# Patient Record
Sex: Male | Born: 1960
Health system: Southern US, Community
[De-identification: ages and names within clinical notes are randomized; demographics above are authoritative.]

## PROBLEM LIST (undated history)

## (undated) DIAGNOSIS — E119 Type 2 diabetes mellitus without complications: Secondary | ICD-10-CM

## (undated) DIAGNOSIS — G4730 Sleep apnea, unspecified: Secondary | ICD-10-CM

## (undated) DIAGNOSIS — K219 Gastro-esophageal reflux disease without esophagitis: Secondary | ICD-10-CM

## (undated) DIAGNOSIS — I10 Essential (primary) hypertension: Secondary | ICD-10-CM

## (undated) HISTORY — PX: APPENDECTOMY: SHX54

## (undated) HISTORY — PX: HERNIA REPAIR: SHX51

## (undated) HISTORY — PX: BACK SURGERY: SHX140

## (undated) HISTORY — PX: SINOSCOPY: SHX187

## (undated) HISTORY — PX: KNEE ARTHROSCOPY: SUR90

## (undated) HISTORY — DX: Type 2 diabetes mellitus without complications: E11.9

---

## 1987-11-14 HISTORY — PX: RHINOPLASTY: SUR1284

## 1998-06-14 ENCOUNTER — Observation Stay (HOSPITAL_COMMUNITY): Admission: RE | Admit: 1998-06-14 | Discharge: 1998-06-15 | Payer: Self-pay | Admitting: Specialist

## 1999-12-05 ENCOUNTER — Emergency Department (HOSPITAL_COMMUNITY): Admission: EM | Admit: 1999-12-05 | Discharge: 1999-12-06 | Payer: Self-pay | Admitting: Emergency Medicine

## 1999-12-06 ENCOUNTER — Encounter: Payer: Self-pay | Admitting: Emergency Medicine

## 2005-04-21 ENCOUNTER — Inpatient Hospital Stay (HOSPITAL_COMMUNITY): Admission: RE | Admit: 2005-04-21 | Discharge: 2005-04-24 | Payer: Self-pay | Admitting: Specialist

## 2006-01-17 ENCOUNTER — Emergency Department (HOSPITAL_COMMUNITY): Admission: EM | Admit: 2006-01-17 | Discharge: 2006-01-17 | Payer: Self-pay | Admitting: Family Medicine

## 2011-02-13 ENCOUNTER — Ambulatory Visit (HOSPITAL_BASED_OUTPATIENT_CLINIC_OR_DEPARTMENT_OTHER)
Admission: RE | Admit: 2011-02-13 | Discharge: 2011-02-13 | Disposition: A | Discharge status: Home / Self Care | Payer: 59 | Source: Ambulatory Visit | Attending: Specialist | Admitting: Specialist

## 2011-02-13 DIAGNOSIS — I10 Essential (primary) hypertension: Secondary | ICD-10-CM | POA: Insufficient documentation

## 2011-02-13 DIAGNOSIS — M23305 Other meniscus derangements, unspecified medial meniscus, unspecified knee: Secondary | ICD-10-CM | POA: Insufficient documentation

## 2011-02-13 DIAGNOSIS — M23302 Other meniscus derangements, unspecified lateral meniscus, unspecified knee: Secondary | ICD-10-CM | POA: Insufficient documentation

## 2011-02-13 DIAGNOSIS — M1710 Unilateral primary osteoarthritis, unspecified knee: Secondary | ICD-10-CM | POA: Insufficient documentation

## 2011-02-13 DIAGNOSIS — Z79899 Other long term (current) drug therapy: Secondary | ICD-10-CM | POA: Insufficient documentation

## 2011-02-13 DIAGNOSIS — E669 Obesity, unspecified: Secondary | ICD-10-CM | POA: Insufficient documentation

## 2011-02-13 DIAGNOSIS — K219 Gastro-esophageal reflux disease without esophagitis: Secondary | ICD-10-CM | POA: Insufficient documentation

## 2011-02-13 DIAGNOSIS — Z01812 Encounter for preprocedural laboratory examination: Secondary | ICD-10-CM | POA: Insufficient documentation

## 2011-02-13 DIAGNOSIS — Z01810 Encounter for preprocedural cardiovascular examination: Secondary | ICD-10-CM | POA: Insufficient documentation

## 2011-02-13 LAB — POCT I-STAT 4, (NA,K, GLUC, HGB,HCT)
Hemoglobin: 15.3 g/dL (ref 13.0–17.0)
Potassium: 4 mEq/L (ref 3.5–5.1)
Sodium: 141 mEq/L (ref 135–145)

## 2011-03-01 NOTE — Op Note (Signed)
  NAME:  Victor Contreras, Victor Contreras NO.:  [id number]  MEDICAL RECORD NO.:  [id number]            PATIENT TYPE:  LOCATION:                                 FACILITY:  PHYSICIAN:  Jene Every, M.D.         DATE OF BIRTH:  DATE OF PROCEDURE:  02/13/2011 DATE OF DISCHARGE:                              OPERATIVE REPORT   PREOPERATIVE DIAGNOSES:  Medial meniscus tear and degenerative joint disease, left knee.  POSTOPERATIVE DIAGNOSES:  Medial meniscus tear and degenerative joint disease, left knee, lateral meniscus tear.  PROCEDURE PERFORMED: 1. Left knee arthroscopy. 2. Partial medial and lateral meniscectomy. 3. Chondroplasty of patella.  ASSISTANT:  None.  BRIEF HISTORY:  This is a 50 year old with knee pain, locking, giving way.  MRI, meniscus tear, synovitis, indicated for diagnostic arthroscopy and debridement.  Risks and benefits were discussed including bleeding, infection, no change in symptoms, worsening of symptoms, need for repeat debridement, DVT, PE, anesthetic complications, etc.  TECHNIQUE:  With the patient in supine position after induction of adequate general anesthesia, 2 g Kefzol, left lower extremity was prepped and draped in the usual sterile fashion.  A lateral parapatellar portal and superomedial parapatellar portals were fashioned with a #11 blade.  Ingress cannula atraumatically placed.  Irrigant was utilized to insufflate the joint.  Under direct visualization, medial parapatellar portal was fashioned with a #11 blade after localization with an 18- gauge needle sparing the medial meniscus.  Noted was a posterior horn medial meniscus tear.  I introduced straight basket rongeur, performed a partial medial meniscectomy to a stable base.  Further contoured with the 3-5 Cuda shaver.  Next, this was probed and found to be stable on probe palpation, approximately posterior third was excised.  ACL and PCL were unremarkable.  Lateral compartment  revealed degenerative tearing of lateral meniscus.  This was shaved to a stable base.  Femoral condyle and tibial plateau were unremarkable.  Suprapatellar pouch revealed some minor grade 2 changes of patella and femoral sulcus.  There was normal patellofemoral tracking.  Gutters were unremarkable.  Reexamined all compartments.  No further pathology amenable arthroscopic intervention and therefore removed all instrumentation.  Portals were closed 4-0 nylon simple sutures.  A 0.25% Marcaine with epinephrine was infiltrated in the joint and wound was dressed sterilely.  Awoken without difficulty and transported to recovery room in satisfactory condition.  The patient tolerated the procedure well.  There were no complications.     Jene Every, M.D.     Cordelia Pen  D:  02/13/2011  T:  02/13/2011  Job:  [id number]  Electronically Signed by Jene Every M.D. on 03/01/2011 12:04:50 PM

## 2012-08-27 ENCOUNTER — Encounter (HOSPITAL_COMMUNITY): Payer: Self-pay | Admitting: Emergency Medicine

## 2012-08-27 ENCOUNTER — Emergency Department (HOSPITAL_COMMUNITY)
Admission: EM | Admit: 2012-08-27 | Discharge: 2012-08-27 | Disposition: A | Discharge status: Home / Self Care | Payer: 59 | Attending: Emergency Medicine | Admitting: Emergency Medicine

## 2012-08-27 DIAGNOSIS — I10 Essential (primary) hypertension: Secondary | ICD-10-CM | POA: Insufficient documentation

## 2012-08-27 DIAGNOSIS — M5430 Sciatica, unspecified side: Secondary | ICD-10-CM | POA: Insufficient documentation

## 2012-08-27 DIAGNOSIS — K219 Gastro-esophageal reflux disease without esophagitis: Secondary | ICD-10-CM | POA: Insufficient documentation

## 2012-08-27 HISTORY — DX: Essential (primary) hypertension: I10

## 2012-08-27 HISTORY — DX: Gastro-esophageal reflux disease without esophagitis: K21.9

## 2012-08-27 MED ORDER — HYDROMORPHONE HCL PF 1 MG/ML IJ SOLN
0.5000 mg | Freq: Once | INTRAMUSCULAR | Status: AC
  Administered 2012-08-27: 0.5 mg via INTRAMUSCULAR
  Filled 2012-08-27: qty 1

## 2012-08-27 MED ORDER — HYDROMORPHONE HCL PF 1 MG/ML IJ SOLN
1.0000 mg | Freq: Once | INTRAMUSCULAR | Status: AC
  Administered 2012-08-27: 1 mg via INTRAMUSCULAR
  Filled 2012-08-27: qty 1

## 2012-08-27 MED ORDER — KETOROLAC TROMETHAMINE 30 MG/ML IJ SOLN
30.0000 mg | Freq: Once | INTRAMUSCULAR | Status: AC
  Administered 2012-08-27: 30 mg via INTRAMUSCULAR
  Filled 2012-08-27: qty 1

## 2012-08-27 NOTE — ED Provider Notes (Signed)
History     CSN: [id number]  Arrival date & time 08/27/12  1000   First MD Initiated Contact with Patient 08/27/12 1003      Chief Complaint  Patient presents with  . Back Pain    (Consider location/radiation/quality/duration/timing/severity/associated sxs/prior treatment) HPI  Pt to the ER with back pain by ambulance. He has a history of back problems which include a herniated disc and a ruptured disc. His last surgery was 5 years ago on L4-L5 and he has not had much problems since then. A couple days ago his back began to bother him again. He went to the Urgent Care and they have referred him to pain management. He does not have an appointment scheduled yet. He also called Dr. Shelle Iron and is waiting fo ran appointment to get into their as well. He is taking Soma, Oxycodone, steroids for the pain but he has been unable to get it under control. The pain starts at his left glut and travels all the way down to his foot. He has had some numbness and a lot of pain. No weakness. He is able to walk but it is very painful. He denies IV drug use, fevers, chills, bowel or urine incontinence.   Past Medical History  Diagnosis Date  . Hypertension   . GERD (gastroesophageal reflux disease)     Past Surgical History  Procedure Date  . Back surgery   . Appendectomy   . Knee arthroscopy   . Hernia repair     No family history on file.  History  Substance Use Topics  . Smoking status: Never Smoker   . Smokeless tobacco: Not on file  . Alcohol Use: Yes      Review of Systems  Constitutional: Negative for fever, chills and fatigue.  HENT: Negative for neck pain.   Respiratory: Negative for cough, shortness of breath and wheezing.   Cardiovascular: Negative for chest pain and leg swelling.  Genitourinary: Negative for difficulty urinating.  Musculoskeletal: Positive for back pain. Negative for myalgias and arthralgias.  Neurological: Negative for dizziness and weakness.    Hematological: Does not bruise/bleed easily.  Psychiatric/Behavioral: Negative for confusion and agitation.      Allergies  Review of patient's allergies indicates no known allergies.  Home Medications   Current Outpatient Rx  Name Route Sig Dispense Refill  . AMLODIPINE BESYLATE 5 MG PO TABS Oral Take 5 mg by mouth daily.    Marland Kitchen CARISOPRODOL 350 MG PO TABS Oral Take 350 mg by mouth 3 (three) times daily as needed. Muscle spasm    . CETIRIZINE HCL 10 MG PO TABS Oral Take 10 mg by mouth daily.    . OMEGA-3 FATTY ACIDS 1000 MG PO CAPS Oral Take 1 g by mouth daily.    Marland Kitchen GABAPENTIN 300 MG PO CAPS Oral Take 300 mg by mouth 3 (three) times daily.    . OXYCODONE-ACETAMINOPHEN 5-325 MG PO TABS Oral Take 1 tablet by mouth every 4 (four) hours as needed. pain    . PANTOPRAZOLE SODIUM 40 MG PO TBEC Oral Take 40 mg by mouth daily.    . RED YEAST RICE 600 MG PO CAPS Oral Take 1 capsule by mouth daily.    Marland Kitchen VALSARTAN-HYDROCHLOROTHIAZIDE 160-12.5 MG PO TABS Oral Take 1 tablet by mouth daily.      BP 142/95  Pulse 110  Temp 97.9 F (36.6 C) (Oral)  Resp 16  SpO2 95%  Physical Exam  Nursing note and vitals reviewed. Constitutional: He  appears well-developed and well-nourished. No distress.  HENT:  Head: Normocephalic and atraumatic.  Eyes: Pupils are equal, round, and reactive to light.  Neck: Normal range of motion. Neck supple.  Cardiovascular: Normal rate and regular rhythm.   Pulmonary/Chest: Effort normal.  Abdominal: Soft.  Musculoskeletal:       Legs:       Equal strength to bilateral lower extremities. Neurosensory  function adequate to both legs. Skin color is normal. Skin is warm and moist. I see no step off deformity, no bony tenderness. Pt is able to ambulate without limp. Pain is relieved when sitting in certain positions. ROM is decreased due to pain. No crepitus, laceration, effusion, swelling.  Pulses are normal   Neurological: He is alert.  Skin: Skin is warm and dry.     ED Course  Procedures (including critical care time)  Labs Reviewed - No data to display No results found.   1. Sciatica       MDM  Pt has pain medication at home. Has appropriate follow-up appointments in the works.    11:30am- patients pain after 30mg  IM of Toradol and  1mg  IM Dilaudid is now a 4/10 from a 10/10. Will give another 0.5mg  IM Dilaudid and then discharge.  Referral to Dr. Shelle Iron given. NO meds Rx due to he already has a bunch from home.  Patient with back pain. No neurological deficits. Patient is ambulatory. No warning symptoms of back pain including: loss of bowel or bladder control, night sweats, waking from sleep with back pain, unexplained fevers or weight loss, h/o cancer, IVDU, recent trauma. No concern for cauda equina, epidural abscess, or other serious cause of back pain. Conservative measures such as rest, ice/heat and pain medicine indicated with PCP follow-up if no improvement with conservative management.        Dorthula Matas, PA 08/27/12 1130

## 2012-08-27 NOTE — ED Notes (Signed)
Waiting for transport home.

## 2012-08-27 NOTE — ED Notes (Signed)
BJY:[id number]<FA> Expected date:<BR> Expected time:<BR> Means of arrival:Ambulance<BR> Comments:<BR> Back pain

## 2012-08-27 NOTE — ED Notes (Signed)
Per EMS, patient has history of L4/L5 problems-flare up in last 24 hours-able to stand and sit-pain radiates down left leg

## 2012-08-27 NOTE — ED Provider Notes (Signed)
Medical screening examination/treatment/procedure(s) were performed by non-physician practitioner and as supervising physician I was immediately available for consultation/collaboration.   Mirai Greenwood B. Bernette Mayers, MD 08/27/12 1137

## 2015-11-17 ENCOUNTER — Encounter (INDEPENDENT_AMBULATORY_CARE_PROVIDER_SITE_OTHER): Payer: Self-pay | Admitting: *Deleted

## 2015-12-13 ENCOUNTER — Ambulatory Visit (INDEPENDENT_AMBULATORY_CARE_PROVIDER_SITE_OTHER): Payer: 59 | Admitting: Internal Medicine

## 2015-12-13 ENCOUNTER — Encounter (INDEPENDENT_AMBULATORY_CARE_PROVIDER_SITE_OTHER): Payer: Self-pay | Admitting: Internal Medicine

## 2015-12-13 ENCOUNTER — Telehealth (INDEPENDENT_AMBULATORY_CARE_PROVIDER_SITE_OTHER): Payer: Self-pay | Admitting: *Deleted

## 2015-12-13 ENCOUNTER — Encounter (INDEPENDENT_AMBULATORY_CARE_PROVIDER_SITE_OTHER): Payer: Self-pay

## 2015-12-13 ENCOUNTER — Other Ambulatory Visit (INDEPENDENT_AMBULATORY_CARE_PROVIDER_SITE_OTHER): Payer: Self-pay | Admitting: Internal Medicine

## 2015-12-13 DIAGNOSIS — R195 Other fecal abnormalities: Secondary | ICD-10-CM

## 2015-12-13 DIAGNOSIS — E119 Type 2 diabetes mellitus without complications: Secondary | ICD-10-CM | POA: Insufficient documentation

## 2015-12-13 DIAGNOSIS — Z1211 Encounter for screening for malignant neoplasm of colon: Secondary | ICD-10-CM

## 2015-12-13 DIAGNOSIS — I10 Essential (primary) hypertension: Secondary | ICD-10-CM | POA: Insufficient documentation

## 2015-12-13 MED ORDER — PEG 3350-KCL-NA BICARB-NACL 420 G PO SOLR: 4000.0000 mL | Freq: Once | ORAL | Status: DC

## 2015-12-13 NOTE — Progress Notes (Addendum)
   Subjective:    Patient ID: Victor Contreras, male    DOB: 12-08-1960, 55 y.o.   MRN: [id number]  HPI Referred by Dr. Manuella Ghazi for heme positive stool card. Patient denies seeing any blood in his stools. He says from time to time he hemorrhoids will bleed. Appetite is good. No weight loss.  There is no abdominal pain.' He has a BM daily. No family hx of colon cancer. Diabetic x 2 yrs. HTN x 4-5 yrs.  His last colonoscopy was in 2011 per Dr. Trena Platt records. Patient states he underwent colonoscopy for blood in his stools and colonoscopy was normal. (Dr. Anthony Sar). I will get the colonoscopy report.   11/10/2006: Dr. Anthony Sar: blood in stools, Personal hx of colonic polyps.  Normal exam.  Review of Systems   Past Medical History  Diagnosis Date  . Hypertension   . GERD (gastroesophageal reflux disease)   . Diabetes [facility])     Past Surgical History  Procedure Laterality Date  . Back surgery    . Appendectomy    . Knee arthroscopy    . Hernia repair      No Known Allergies  Current Outpatient Prescriptions on File Prior to Visit  Medication Sig Dispense Refill  . amLODipine (NORVASC) 5 MG tablet Take 5 mg by mouth daily.    . cetirizine (ZYRTEC) 10 MG tablet Take 10 mg by mouth daily.    . fish oil-omega-3 fatty acids 1000 MG capsule Take 1 g by mouth daily.    . pantoprazole (PROTONIX) 40 MG tablet Take 40 mg by mouth daily.    . valsartan-hydrochlorothiazide (DIOVAN-HCT) 160-12.5 MG per tablet Take 1 tablet by mouth daily.     No current facility-administered medications on file prior to visit.         Objective:   Physical Exam Blood pressure 134/70, pulse 76, temperature 98 F (36.7 C), height 5\' 9"  (1.753 m), weight 271 lb 9.6 oz (123.197 kg).  Alert and oriented. Skin warm and dry. Oral mucosa is moist.   . Sclera anicteric, conjunctivae is pink. Thyroid not enlarged. No cervical lymphadenopathy. Lungs clear. Heart regular rate and rhythm.  Abdomen is soft. Bowel  sounds are positive. No hepatomegaly. No abdominal masses felt. No tenderness.  No edema to lower extremities.         Assessment & Plan:  Guaiac positive stool. Colonic neoplasm, polyp, AVM needs to be ruled out.

## 2015-12-13 NOTE — Patient Instructions (Signed)
The risks and benefits such as perforation, bleeding, and infection were reviewed with the patient and is agreeable. 

## 2015-12-13 NOTE — Telephone Encounter (Signed)
Patient needs trilyte 

## 2016-01-12 ENCOUNTER — Ambulatory Visit (HOSPITAL_COMMUNITY)
Admission: RE | Admit: 2016-01-12 | Discharge: 2016-01-12 | Disposition: A | Discharge status: Home / Self Care | Payer: 59 | Source: Ambulatory Visit | Attending: Internal Medicine | Admitting: Internal Medicine

## 2016-01-12 ENCOUNTER — Encounter (HOSPITAL_COMMUNITY)
Admission: RE | Disposition: A | Discharge status: Home / Self Care | Payer: Self-pay | Source: Ambulatory Visit | Attending: Internal Medicine

## 2016-01-12 ENCOUNTER — Encounter (HOSPITAL_COMMUNITY): Payer: Self-pay | Admitting: *Deleted

## 2016-01-12 ENCOUNTER — Encounter (INDEPENDENT_AMBULATORY_CARE_PROVIDER_SITE_OTHER): Payer: Self-pay

## 2016-01-12 DIAGNOSIS — Z79899 Other long term (current) drug therapy: Secondary | ICD-10-CM | POA: Diagnosis not present

## 2016-01-12 DIAGNOSIS — K219 Gastro-esophageal reflux disease without esophagitis: Secondary | ICD-10-CM | POA: Diagnosis not present

## 2016-01-12 DIAGNOSIS — Z86010 Personal history of colonic polyps: Secondary | ICD-10-CM | POA: Diagnosis not present

## 2016-01-12 DIAGNOSIS — Z794 Long term (current) use of insulin: Secondary | ICD-10-CM | POA: Diagnosis not present

## 2016-01-12 DIAGNOSIS — I10 Essential (primary) hypertension: Secondary | ICD-10-CM | POA: Diagnosis not present

## 2016-01-12 DIAGNOSIS — D120 Benign neoplasm of cecum: Secondary | ICD-10-CM | POA: Diagnosis not present

## 2016-01-12 DIAGNOSIS — D123 Benign neoplasm of transverse colon: Secondary | ICD-10-CM | POA: Insufficient documentation

## 2016-01-12 DIAGNOSIS — E119 Type 2 diabetes mellitus without complications: Secondary | ICD-10-CM | POA: Diagnosis not present

## 2016-01-12 DIAGNOSIS — R195 Other fecal abnormalities: Secondary | ICD-10-CM | POA: Diagnosis not present

## 2016-01-12 DIAGNOSIS — K921 Melena: Secondary | ICD-10-CM | POA: Insufficient documentation

## 2016-01-12 HISTORY — PX: COLONOSCOPY: SHX5424

## 2016-01-12 LAB — GLUCOSE, CAPILLARY: Glucose-Capillary: 134 mg/dL — ABNORMAL HIGH (ref 65–99)

## 2016-01-12 SURGERY — COLONOSCOPY
Anesthesia: Moderate Sedation

## 2016-01-12 MED ORDER — MEPERIDINE HCL 50 MG/ML IJ SOLN
INTRAMUSCULAR | Status: DC | PRN
  Administered 2016-01-12 (×2): 25 mg

## 2016-01-12 MED ORDER — MEPERIDINE HCL 50 MG/ML IJ SOLN
INTRAMUSCULAR | Status: AC
  Filled 2016-01-12: qty 1

## 2016-01-12 MED ORDER — MIDAZOLAM HCL 5 MG/5ML IJ SOLN
INTRAMUSCULAR | Status: DC | PRN
  Administered 2016-01-12: 2 mg via INTRAVENOUS
  Administered 2016-01-12: 3 mg via INTRAVENOUS
  Administered 2016-01-12: 2 mg via INTRAVENOUS
  Administered 2016-01-12: 1 mg via INTRAVENOUS
  Administered 2016-01-12: 2 mg via INTRAVENOUS

## 2016-01-12 MED ORDER — MIDAZOLAM HCL 5 MG/5ML IJ SOLN
INTRAMUSCULAR | Status: AC
  Filled 2016-01-12: qty 10

## 2016-01-12 MED ORDER — STERILE WATER FOR IRRIGATION IR SOLN
Status: DC | PRN
  Administered 2016-01-12: 12:00:00

## 2016-01-12 MED ORDER — SODIUM CHLORIDE 0.9 % IV SOLN
INTRAVENOUS | Status: DC
  Administered 2016-01-12: 1000 mL via INTRAVENOUS

## 2016-01-12 NOTE — Op Note (Signed)
COLONOSCOPY PROCEDURE REPORT  PATIENT:  Victor Contreras  MR#:  [id number] Birthdate:  1960-11-30, 55 y.o., male Endoscopist:  Dr. Rogene Houston, MD Referred By:  Dr. Monico Blitz, MD   Procedure Date: 01/12/2016  Procedure:   Colonoscopy with snare polypectomy and clip application  Indications: Patient is 55 year old African male who was noted to have heme-positive stool. Recent hemoglobin was 14.3 g. He has history of colonic polyps. He denies abdominal pain change in bowel habits rectal bleeding or melena. He has occasional hematochezia in the form of blood and tissue with this bowel movements.  Informed Consent:  The procedure and risks were reviewed with the patient and informed consent was obtained.  Medications:  Demerol 50 mg IV Versed 10 mg IV  First dose administered at 1143  Last dose administered at  1242  Description of procedure:  After a digital rectal exam was performed, that colonoscope was advanced from the anus through the rectum and colon to the area of the cecum, ileocecal valve and appendiceal orifice. The cecum was deeply intubated. These structures were well-seen and photographed for the record. From the level of the cecum and ileocecal valve, the scope was slowly and cautiously withdrawn. The mucosal surfaces were carefully surveyed utilizing scope tip to flexion to facilitate fold flattening as needed. The scope was pulled down into the rectum where a thorough exam including retroflexion was performed. Terminal ileum was also examined.  Findings:   Prep satisfactory. Very tortuous splenic flexure. Normal mucosa of terminal ileum. 12 mm polyp hot snared from cecum. Single indistinct clip applied to polypectomy site.another clip was opened but not to use. 7 mm polyp hot snare from distal transverse colon. Small polyp ablated via cold biopsy from splenic flexure. Normal rectal mucosa and anorectal junction.  Cecal and transverse colon polyps were retrieved using  Roth net.   Therapeutic/Diagnostic Maneuvers Performed:  See above  Complications:  None  EBL: None  Cecal Withdrawal Time:  34 minutes  Impression: Normal mucosa of terminal ileum. 12 mm cecal polyp hot snared. Single instinct clip applied to polypectomy site. 7 mm polyp hot snared from distal transverse colon. Small polyp ablated via cold biopsy from splenic flexure and was submitted along with polyp from transverse colon.   Recommendations:  Standard instructions given. No aspirin or NSAIDs for 1 week. I will contact patient with biopsy results and further recommendations.  REHMAN,NAJEEB U  01/12/2016 1:06 PM  CC: Dr. Monico Blitz, MD & Dr. Rayne Du ref. provider found

## 2016-01-12 NOTE — Discharge Instructions (Signed)
No aspirin or NSAIDs for 1 week. °Resume usual medications and diet. °No driving for 24 hours. °Physician will call with biopsy results. ° ° ° ° °Colonoscopy, Care After °These instructions give you information on caring for yourself after your procedure. Your doctor may also give you more specific instructions. Call your doctor if you have any problems or questions after your procedure. °HOME CARE °· Do not drive for 24 hours. °· Do not sign important papers or use machinery for 24 hours. °· You may shower. °· You may go back to your usual activities, but go slower for the first 24 hours. °· Take rest breaks often during the first 24 hours. °· Walk around or use warm packs on your belly (abdomen) if you have belly cramping or gas. °· Drink enough fluids to keep your pee (urine) clear or pale yellow. °· Resume your normal diet. Avoid heavy or fried foods. °· Avoid drinking alcohol for 24 hours or as told by your doctor. °· Only take medicines as told by your doctor. °If a tissue sample (biopsy) was taken during the procedure:  °· Do not take aspirin or blood thinners for 7 days, or as told by your doctor. °· Do not drink alcohol for 7 days, or as told by your doctor. °· Eat soft foods for the first 24 hours. °GET HELP IF: °You still have a small amount of blood in your poop (stool) 2-3 days after the procedure. °GET HELP RIGHT AWAY IF: °· You have more than a small amount of blood in your poop. °· You see clumps of tissue (blood clots) in your poop. °· Your belly is puffy (swollen). °· You feel sick to your stomach (nauseous) or throw up (vomit). °· You have a fever. °· You have belly pain that gets worse and medicine does not help. °MAKE SURE YOU: °· Understand these instructions. °· Will watch your condition. °· Will get help right away if you are not doing well or get worse. °  °This information is not intended to replace advice given to you by your health care provider. Make sure you discuss any questions you  have with your health care provider. °  °Document Released: 12/02/2010 Document Revised: 11/04/2013 Document Reviewed: 07/07/2013 °Elsevier Interactive Patient Education ©2016 [employer] ° ° °Colon Polyps °Polyps are lumps of extra tissue growing inside the body. Polyps can grow in the large intestine (colon). Most colon polyps are noncancerous (benign). However, some colon polyps can become cancerous over time. Polyps that are larger than a pea may be harmful. To be safe, caregivers remove and test all polyps. °CAUSES  °Polyps form when mutations in the genes cause your cells to grow and divide even though no more tissue is needed. °RISK FACTORS °There are a number of risk factors that can increase your chances of getting colon polyps. They include: °· Being older than 50 years. °· Family history of colon polyps or colon cancer. °· Long-term colon diseases, such as colitis or Crohn disease. °· Being overweight. °· Smoking. °· Being inactive. °· Drinking too much alcohol. °SYMPTOMS  °Most small polyps do not cause symptoms. If symptoms are present, they may include: °· Blood in the stool. The stool may look dark red or black. °· Constipation or diarrhea that lasts longer than 1 week. °DIAGNOSIS °People often do not know they have polyps until their caregiver finds them during a regular checkup. Your caregiver can use 4 tests to check for polyps: °· Digital rectal exam.   The caregiver wears gloves and feels inside the rectum. This test would find polyps only in the rectum. °· Barium enema. The caregiver puts a liquid called barium into your rectum before taking X-rays of your colon. Barium makes your colon look white. Polyps are dark, so they are easy to see in the X-ray pictures. °· Sigmoidoscopy. A thin, flexible tube (sigmoidoscope) is placed into your rectum. The sigmoidoscope has a light and tiny camera in it. The caregiver uses the sigmoidoscope to look at the last third of your colon. °· Colonoscopy. This  test is like sigmoidoscopy, but the caregiver looks at the entire colon. This is the most common method for finding and removing polyps. °TREATMENT  °Any polyps will be removed during a sigmoidoscopy or colonoscopy. The polyps are then tested for cancer. °PREVENTION  °To help lower your risk of getting more colon polyps: °· Eat plenty of fruits and vegetables. Avoid eating fatty foods. °· Do not smoke. °· Avoid drinking alcohol. °· Exercise every day. °· Lose weight if recommended by your caregiver. °· Eat plenty of calcium and folate. Foods that are rich in calcium include milk, cheese, and broccoli. Foods that are rich in folate include chickpeas, kidney beans, and spinach. °HOME CARE INSTRUCTIONS °Keep all follow-up appointments as directed by your caregiver. You may need periodic exams to check for polyps. °SEEK MEDICAL CARE IF: °You notice bleeding during a bowel movement. °  °This information is not intended to replace advice given to you by your health care provider. Make sure you discuss any questions you have with your health care provider. °  °Document Released: 07/26/2004 Document Revised: 11/20/2014 Document Reviewed: 01/09/2012 °Elsevier Interactive Patient Education ©2016 Elsevier Inc. ° °

## 2016-01-12 NOTE — H&P (Signed)
Victor Contreras is an 55 y.o. male.   Chief Complaint:  Patient is here for colonoscopy. HPI:  Asian is 55 year old African-American male who was recently found to have heme-positive stool. He is therefore undergoing diagnostic colonoscopy. He also has history of colonic polyps. These were found on his first colonoscopy by Dr. Anthony Sar several years ago.  No polyps were found on his last colonoscopy of December 2007.  He denies abdominal pain or change in bowel habits. He has occasional hematochezia in the form of blood in the tissue. He denies frank rectal bleeding or melena. Family history is negative for CRC.   Past Medical History  Diagnosis Date  . Hypertension   . GERD (gastroesophageal reflux disease)   . Diabetes [facility])     Past Surgical History  Procedure Laterality Date  . Back surgery    . Appendectomy    . Knee arthroscopy    . Hernia repair      History reviewed. No pertinent family history. Social History:  reports that he has never smoked. He does not have any smokeless tobacco history on file. He reports that he drinks alcohol. He reports that he does not use illicit drugs.  Allergies: No Known Allergies  Medications Prior to Admission  Medication Sig Dispense Refill  . amLODipine (NORVASC) 5 MG tablet Take 5 mg by mouth daily.    . canagliflozin (INVOKANA) 300 MG TABS tablet Take 300 mg by mouth daily before breakfast.    . cetirizine (ZYRTEC) 10 MG tablet Take 10 mg by mouth daily.    . fish oil-omega-3 fatty acids 1000 MG capsule Take 1 g by mouth daily.    . insulin detemir (LEVEMIR) 100 UNIT/ML injection Inject 10 Units into the skin daily.     . pantoprazole (PROTONIX) 40 MG tablet Take 40 mg by mouth daily.    . polyethylene glycol-electrolytes (NULYTELY/GOLYTELY) 420 g solution Take 4,000 mLs by mouth once. 4000 mL 0  . rosuvastatin (CRESTOR) 5 MG tablet Take 5 mg by mouth daily.    . sitaGLIPtin (JANUVIA) 100 MG tablet Take 100 mg by mouth daily.    .  tamsulosin (FLOMAX) 0.4 MG CAPS capsule Take 0.4 mg by mouth.    . valsartan-hydrochlorothiazide (DIOVAN-HCT) 160-12.5 MG per tablet Take 1 tablet by mouth daily.      Results for orders placed or performed during the hospital encounter of 01/12/16 (from the past 48 hour(s))  Glucose, capillary     Status: Abnormal   Collection Time: 01/12/16 11:15 AM  Result Value Ref Range   Glucose-Capillary 134 (H) 65 - 99 mg/dL   No results found.  ROS  Blood pressure 129/82, pulse 101, temperature 98.6 F (37 C), temperature source Oral, resp. rate 13, height 5\' 9"  (1.753 m), weight 245 lb (111.131 kg), SpO2 95 %. Physical Exam  Constitutional:  Well-developed mildly obese African-American male in NAD.  HENT:  Mouth/Throat: Oropharynx is clear and moist.  Both parotid glands are prominent but very soft on palpation.  Eyes: Conjunctivae are normal. No scleral icterus.  Neck: No thyromegaly present.  Cardiovascular: Normal rate, regular rhythm and normal heart sounds.   No murmur heard. Respiratory: Effort normal and breath sounds normal.  GI:  Appendectomy scar. Abdomen is full but soft and nontender without organomegaly or masses.  Musculoskeletal: He exhibits no edema.  Lymphadenopathy:    He has no cervical adenopathy.  Neurological: He is alert.  Skin: Skin is warm and dry.  Assessment/Plan  Heme positive stool and history of colonic polyps.  Diagnostic colonoscopy.  Rogene Houston, MD 01/12/2016, 11:37 AM

## 2016-01-14 ENCOUNTER — Encounter (HOSPITAL_COMMUNITY): Payer: Self-pay | Admitting: Internal Medicine

## 2016-08-04 ENCOUNTER — Encounter (INDEPENDENT_AMBULATORY_CARE_PROVIDER_SITE_OTHER): Payer: Self-pay

## 2017-10-13 ENCOUNTER — Other Ambulatory Visit: Payer: Self-pay

## 2017-10-13 ENCOUNTER — Emergency Department (HOSPITAL_COMMUNITY): Payer: 59

## 2017-10-13 ENCOUNTER — Encounter (HOSPITAL_COMMUNITY): Payer: Self-pay | Admitting: *Deleted

## 2017-10-13 ENCOUNTER — Inpatient Hospital Stay (HOSPITAL_COMMUNITY)
Admission: EM | Admit: 2017-10-13 | Discharge: 2017-10-16 | DRG: 378 | Disposition: A | Discharge status: Home / Self Care | Payer: 59 | Attending: Internal Medicine | Admitting: Internal Medicine

## 2017-10-13 DIAGNOSIS — K219 Gastro-esophageal reflux disease without esophagitis: Secondary | ICD-10-CM | POA: Diagnosis present

## 2017-10-13 DIAGNOSIS — Z87891 Personal history of nicotine dependence: Secondary | ICD-10-CM | POA: Diagnosis not present

## 2017-10-13 DIAGNOSIS — G8929 Other chronic pain: Secondary | ICD-10-CM | POA: Diagnosis present

## 2017-10-13 DIAGNOSIS — E785 Hyperlipidemia, unspecified: Secondary | ICD-10-CM | POA: Diagnosis present

## 2017-10-13 DIAGNOSIS — N289 Disorder of kidney and ureter, unspecified: Secondary | ICD-10-CM | POA: Diagnosis not present

## 2017-10-13 DIAGNOSIS — M549 Dorsalgia, unspecified: Secondary | ICD-10-CM | POA: Diagnosis present

## 2017-10-13 DIAGNOSIS — E669 Obesity, unspecified: Secondary | ICD-10-CM | POA: Diagnosis present

## 2017-10-13 DIAGNOSIS — K921 Melena: Secondary | ICD-10-CM | POA: Diagnosis not present

## 2017-10-13 DIAGNOSIS — E118 Type 2 diabetes mellitus with unspecified complications: Secondary | ICD-10-CM | POA: Diagnosis not present

## 2017-10-13 DIAGNOSIS — I10 Essential (primary) hypertension: Secondary | ICD-10-CM | POA: Diagnosis present

## 2017-10-13 DIAGNOSIS — Z7982 Long term (current) use of aspirin: Secondary | ICD-10-CM

## 2017-10-13 DIAGNOSIS — Z791 Long term (current) use of non-steroidal anti-inflammatories (NSAID): Secondary | ICD-10-CM

## 2017-10-13 DIAGNOSIS — I959 Hypotension, unspecified: Secondary | ICD-10-CM | POA: Diagnosis present

## 2017-10-13 DIAGNOSIS — D62 Acute posthemorrhagic anemia: Secondary | ICD-10-CM | POA: Diagnosis present

## 2017-10-13 DIAGNOSIS — Z6838 Body mass index (BMI) 38.0-38.9, adult: Secondary | ICD-10-CM

## 2017-10-13 DIAGNOSIS — Z794 Long term (current) use of insulin: Secondary | ICD-10-CM | POA: Diagnosis not present

## 2017-10-13 DIAGNOSIS — K922 Gastrointestinal hemorrhage, unspecified: Secondary | ICD-10-CM | POA: Diagnosis not present

## 2017-10-13 DIAGNOSIS — I4581 Long QT syndrome: Secondary | ICD-10-CM | POA: Diagnosis present

## 2017-10-13 DIAGNOSIS — Z23 Encounter for immunization: Secondary | ICD-10-CM | POA: Diagnosis not present

## 2017-10-13 DIAGNOSIS — N179 Acute kidney failure, unspecified: Secondary | ICD-10-CM | POA: Diagnosis present

## 2017-10-13 DIAGNOSIS — E871 Hypo-osmolality and hyponatremia: Secondary | ICD-10-CM | POA: Diagnosis present

## 2017-10-13 DIAGNOSIS — E119 Type 2 diabetes mellitus without complications: Secondary | ICD-10-CM | POA: Diagnosis present

## 2017-10-13 DIAGNOSIS — E861 Hypovolemia: Secondary | ICD-10-CM | POA: Diagnosis present

## 2017-10-13 DIAGNOSIS — K449 Diaphragmatic hernia without obstruction or gangrene: Secondary | ICD-10-CM | POA: Diagnosis present

## 2017-10-13 DIAGNOSIS — K228 Other specified diseases of esophagus: Secondary | ICD-10-CM | POA: Diagnosis not present

## 2017-10-13 HISTORY — DX: Sleep apnea, unspecified: G47.30

## 2017-10-13 LAB — CBC WITH DIFFERENTIAL/PLATELET
BASOS PCT: 0 %
Basophils Absolute: 0 10*3/uL (ref 0.0–0.1)
Eosinophils Absolute: 0.1 10*3/uL (ref 0.0–0.7)
Eosinophils Relative: 1 %
HEMATOCRIT: 34.6 % — AB (ref 39.0–52.0)
Hemoglobin: 10.9 g/dL — ABNORMAL LOW (ref 13.0–17.0)
Lymphocytes Relative: 38 %
Lymphs Abs: 4.1 10*3/uL — ABNORMAL HIGH (ref 0.7–4.0)
MCH: 27.7 pg (ref 26.0–34.0)
MCHC: 31.5 g/dL (ref 30.0–36.0)
MCV: 88 fL (ref 78.0–100.0)
MONO ABS: 0.9 10*3/uL (ref 0.1–1.0)
MONOS PCT: 8 %
NEUTROS ABS: 5.8 10*3/uL (ref 1.7–7.7)
Neutrophils Relative %: 53 %
Platelets: 265 10*3/uL (ref 150–400)
RBC: 3.93 MIL/uL — ABNORMAL LOW (ref 4.22–5.81)
RDW: 13.9 % (ref 11.5–15.5)
WBC: 10.9 10*3/uL — ABNORMAL HIGH (ref 4.0–10.5)

## 2017-10-13 LAB — COMPREHENSIVE METABOLIC PANEL
ALT: 21 U/L (ref 17–63)
ANION GAP: 13 (ref 5–15)
AST: 34 U/L (ref 15–41)
Albumin: 3.9 g/dL (ref 3.5–5.0)
Alkaline Phosphatase: 40 U/L (ref 38–126)
BUN: 23 mg/dL — ABNORMAL HIGH (ref 6–20)
CHLORIDE: 95 mmol/L — AB (ref 101–111)
CO2: 23 mmol/L (ref 22–32)
CREATININE: 1.3 mg/dL — AB (ref 0.61–1.24)
Calcium: 9.3 mg/dL (ref 8.9–10.3)
GFR, EST NON AFRICAN AMERICAN: 60 mL/min — AB (ref >60)
Glucose, Bld: 256 mg/dL — ABNORMAL HIGH (ref 65–99)
POTASSIUM: 3 mmol/L — AB (ref 3.5–5.1)
Sodium: 131 mmol/L — ABNORMAL LOW (ref 135–145)
Total Bilirubin: 0.6 mg/dL (ref 0.3–1.2)
Total Protein: 7 g/dL (ref 6.5–8.1)

## 2017-10-13 LAB — HEMOGLOBIN AND HEMATOCRIT, BLOOD
HEMATOCRIT: 32.1 % — AB (ref 39.0–52.0)
Hemoglobin: 10.2 g/dL — ABNORMAL LOW (ref 13.0–17.0)

## 2017-10-13 LAB — LIPASE, BLOOD: Lipase: 42 U/L (ref 11–51)

## 2017-10-13 LAB — POC OCCULT BLOOD, ED: Fecal Occult Bld: POSITIVE — AB

## 2017-10-13 LAB — TROPONIN I

## 2017-10-13 LAB — PROTIME-INR
INR: 1.07
PROTHROMBIN TIME: 13.8 s (ref 11.4–15.2)

## 2017-10-13 LAB — CBG MONITORING, ED: GLUCOSE-CAPILLARY: 205 mg/dL — AB (ref 65–99)

## 2017-10-13 LAB — PREPARE RBC (CROSSMATCH)

## 2017-10-13 MED ORDER — PANTOPRAZOLE SODIUM 40 MG IV SOLR
40.0000 mg | Freq: Once | INTRAVENOUS | Status: AC
Start: 2017-10-13 — End: 2017-10-13
  Administered 2017-10-13: 40 mg via INTRAVENOUS
  Filled 2017-10-13: qty 40

## 2017-10-13 MED ORDER — PANTOPRAZOLE SODIUM 40 MG IV SOLR: 40.0000 mg | Freq: Two times a day (BID) | INTRAVENOUS | Status: DC

## 2017-10-13 MED ORDER — ONDANSETRON HCL 4 MG PO TABS: 4.0000 mg | ORAL_TABLET | Freq: Four times a day (QID) | ORAL | Status: DC | PRN

## 2017-10-13 MED ORDER — ONDANSETRON HCL 4 MG/2ML IJ SOLN
4.0000 mg | Freq: Once | INTRAMUSCULAR | Status: AC
  Administered 2017-10-13: 4 mg via INTRAVENOUS
  Filled 2017-10-13: qty 2

## 2017-10-13 MED ORDER — PANTOPRAZOLE SODIUM 40 MG IV SOLR: 40.0000 mg | Freq: Once | INTRAVENOUS | Status: DC

## 2017-10-13 MED ORDER — FENTANYL CITRATE (PF) 100 MCG/2ML IJ SOLN
25.0000 ug | INTRAMUSCULAR | Status: DC | PRN
  Administered 2017-10-16: 25 ug via INTRAVENOUS
  Filled 2017-10-13: qty 2

## 2017-10-13 MED ORDER — MAGNESIUM SULFATE 2 GM/50ML IV SOLN
2.0000 g | Freq: Once | INTRAVENOUS | Status: AC
  Administered 2017-10-14: 2 g via INTRAVENOUS
  Filled 2017-10-13: qty 50

## 2017-10-13 MED ORDER — FENTANYL CITRATE (PF) 100 MCG/2ML IJ SOLN
50.0000 ug | Freq: Once | INTRAMUSCULAR | Status: AC
  Administered 2017-10-13: 50 ug via INTRAVENOUS
  Filled 2017-10-13: qty 2

## 2017-10-13 MED ORDER — SODIUM CHLORIDE 0.9 % IV BOLUS (SEPSIS)
1000.0000 mL | Freq: Once | INTRAVENOUS | Status: AC
  Administered 2017-10-13: 1000 mL via INTRAVENOUS

## 2017-10-13 MED ORDER — SODIUM CHLORIDE 0.9% FLUSH
3.0000 mL | Freq: Two times a day (BID) | INTRAVENOUS | Status: DC
  Administered 2017-10-14 – 2017-10-16 (×4): 3 mL via INTRAVENOUS

## 2017-10-13 MED ORDER — ACETAMINOPHEN 325 MG PO TABS: 650.0000 mg | ORAL_TABLET | Freq: Four times a day (QID) | ORAL | Status: DC | PRN

## 2017-10-13 MED ORDER — ACETAMINOPHEN 650 MG RE SUPP: 650.0000 mg | Freq: Four times a day (QID) | RECTAL | Status: DC | PRN

## 2017-10-13 MED ORDER — POTASSIUM CHLORIDE IN NACL 40-0.9 MEQ/L-% IV SOLN
INTRAVENOUS | Status: AC
  Administered 2017-10-14 (×2): 125 mL/h via INTRAVENOUS
  Filled 2017-10-13 (×2): qty 1000

## 2017-10-13 MED ORDER — SODIUM CHLORIDE 0.9 % IV SOLN: 10.0000 mL/h | Freq: Once | INTRAVENOUS | Status: DC

## 2017-10-13 MED ORDER — SODIUM CHLORIDE 0.9 % IV SOLN
8.0000 mg/h | INTRAVENOUS | Status: DC
  Administered 2017-10-14 – 2017-10-15 (×3): 8 mg/h via INTRAVENOUS
  Filled 2017-10-13 (×6): qty 80

## 2017-10-13 MED ORDER — ONDANSETRON HCL 4 MG/2ML IJ SOLN: 4.0000 mg | Freq: Four times a day (QID) | INTRAMUSCULAR | Status: DC | PRN

## 2017-10-13 MED ORDER — INSULIN ASPART 100 UNIT/ML ~~LOC~~ SOLN
0.0000 [IU] | SUBCUTANEOUS | Status: DC
  Administered 2017-10-14 (×3): 1 [IU] via SUBCUTANEOUS
  Administered 2017-10-14: 3 [IU] via SUBCUTANEOUS
  Administered 2017-10-15: 1 [IU] via SUBCUTANEOUS

## 2017-10-13 MED ORDER — SODIUM CHLORIDE 0.9 % IV BOLUS (SEPSIS)
2000.0000 mL | Freq: Once | INTRAVENOUS | Status: AC
  Administered 2017-10-13: 2000 mL via INTRAVENOUS

## 2017-10-13 MED ORDER — SODIUM CHLORIDE 0.9 % IV SOLN: INTRAVENOUS | Status: DC

## 2017-10-13 NOTE — ED Triage Notes (Signed)
Pt noted blood in stools since Friday, today became dizzy and felt like passing out.  Pt denies sob. + abd pain at times.

## 2017-10-13 NOTE — ED Notes (Signed)
Pt states he had seen PCP on Friday as well.

## 2017-10-13 NOTE — ED Notes (Signed)
Dr Blima Singer at bedside.

## 2017-10-13 NOTE — H&P (Addendum)
History and Physical    Victor Contreras:[id number] DOB: [birth year] DOA: 10/13/2017  PCP: Monico Blitz, MD   Patient coming from: Home  Chief Complaint: Rectal bleeding, lightheadedness, abdominal pain    HPI: Victor Contreras is a 56 y.o. male with medical history significant for hypertension, insulin-dependent diabetes mellitus, and GERD, now presenting to the emergency department with 2 days of rectal bleeding, intermittent abdominal pain, and lightheadedness.  Patient reports that he had been in his usual state of health until yesterday when he experienced  4 or 5 episodes of rectal bleeding.  This is described as dark mixed with red blood.  He also noted some lightheadedness upon standing yesterday.  Symptoms persisted today and he has become presyncopal.  Also developed LUQ abdominal pain today. He had a colonoscopy with polypectomy last year and reports undergoing remote EGD but does not recall results. Denies history of liver disease and reports drinking alcohol only once or twice per week. Reports daily Alleve use for chronic back pain, also takes daily ASA 81, and reports continued adherence with Protonix 40 mg qD.  ED Course: Upon arrival to the ED, patient is found to be afebrile, saturating well on room air, tachycardic to 130, and with blood pressure 79/53.  EKG features a sinus tachycardia with rate 125 and QTc interval of 520 ms.  Chest x-ray is negative for acute cardiopulmonary disease.  Chemistry panel reveals a sodium of 131, potassium 3.0, BUN 23, and creatinine 1.30.  CBC is notable for a leukocytosis to 10,900 and a normocytic troponin is undetectable.  Patient was given 2 L normal saline, 40 mg IV Protonix, fentanyl, and 2 units of packed red blood cells were ordered for immediate transfusion.  GI was consulted by the ED physician and recommended medical admission with GI to consult.  Patient's blood pressure and tachycardia improved with the IV fluids and he was started on 1/3 L  of normal saline.  He will be admitted to the ICU for ongoing evaluation and management of acute GI bleed with tachycardia and hypotension on arrival.  Review of Systems:  All other systems reviewed and apart from HPI, are negative.  Past Medical History:  Diagnosis Date  . Diabetes ([place])   . GERD (gastroesophageal reflux disease)   . Hypertension     Past Surgical History:  Procedure Laterality Date  . APPENDECTOMY    . BACK SURGERY    . COLONOSCOPY N/A 01/12/2016   Procedure: COLONOSCOPY;  Surgeon: Rogene Houston, MD;  Location: [facility];  Service: Endoscopy;  Laterality: N/A;  12:00  . HERNIA REPAIR    . KNEE ARTHROSCOPY       reports that  has never smoked. he has never used smokeless tobacco. He reports that he drinks alcohol. He reports that he does not use drugs.  No Known Allergies  Family History  Problem Relation Age of Onset  . Sudden Cardiac Death Neg Hx      Prior to Admission medications   Medication Sig Start Date End Date Taking? Authorizing Provider  amLODipine (NORVASC) 5 MG tablet Take 5 mg by mouth daily.    [provider]  canagliflozin (INVOKANA) 300 MG TABS tablet Take 300 mg by mouth daily before breakfast.    [provider]  cetirizine (ZYRTEC) 10 MG tablet Take 10 mg by mouth daily.    [provider]  fish oil-omega-3 fatty acids 1000 MG capsule Take 1 g by mouth daily.  [provider]  insulin detemir (LEVEMIR) 100 UNIT/ML injection Inject 10 Units into the skin daily.     [provider]  pantoprazole (PROTONIX) 40 MG tablet Take 40 mg by mouth daily.    [provider]  rosuvastatin (CRESTOR) 5 MG tablet Take 5 mg by mouth daily.    [provider]  sitaGLIPtin (JANUVIA) 100 MG tablet Take 100 mg by mouth daily.    [provider]  tamsulosin (FLOMAX) 0.4 MG CAPS capsule Take 0.4 mg by mouth.    [provider]  valsartan-hydrochlorothiazide (DIOVAN-HCT)  160-12.5 MG per tablet Take 1 tablet by mouth daily.    [provider]    Physical Exam: Vitals:   10/13/17 2310 10/13/17 2320 10/13/17 2330 10/13/17 2340  BP: 90/61 (!) 81/71 106/62 (!) 104/59  Pulse: (!) 103 (!) 112 (!) 109 (!) 111  Resp: 17 20 16 17   Temp:      TempSrc:      SpO2: 100% 100% 100% 100%  Weight:      Height:          Constitutional: NAD, calm, in apparent discomfort Eyes: PERTLA, lids and conjunctivae normal ENMT: Mucous membranes are moist. Posterior pharynx clear of any exudate or lesions.   Neck: normal, supple, no masses, no thyromegaly Respiratory: clear to auscultation bilaterally, no wheezing, no crackles. Normal respiratory effort.  Cardiovascular: Rate ~110 and regular. No extremity edema. No significant JVD. Abdomen: Mild distension, soft, tender in left abdomen, no rebound pain or guarding. Bowel sounds active.  Musculoskeletal: no clubbing / cyanosis. No joint deformity upper and lower extremities.   Skin: no significant rashes, lesions, ulcers. Warm, dry, well-perfused. Neurologic: CN 2-12 grossly intact. Sensation intact. Strength 5/5 in all 4 limbs.  Psychiatric: Alert and oriented x 3. Calm, cooperative.     Labs on Admission: I have personally reviewed following labs and imaging studies  CBC: Recent Labs  Lab 10/13/17 2147  WBC 10.9*  NEUTROABS 5.8  HGB 10.9*  HCT 34.6*  MCV 88.0  PLT 195   Basic Metabolic Panel: Recent Labs  Lab 10/13/17 2147  NA 131*  K 3.0*  CL 95*  CO2 23  GLUCOSE 256*  BUN 23*  CREATININE 1.30*  CALCIUM 9.3   GFR: Estimated Creatinine Clearance: 80.4 mL/min (A) (by C-G formula based on SCr of 1.3 mg/dL (H)). Liver Function Tests: Recent Labs  Lab 10/13/17 2147  AST 34  ALT 21  ALKPHOS 40  BILITOT 0.6  PROT 7.0  ALBUMIN 3.9   Recent Labs  Lab 10/13/17 2147  LIPASE 42   No results for input(s): AMMONIA in the last 168 hours. Coagulation Profile: Recent Labs  Lab  10/13/17 2147  INR 1.07   Cardiac Enzymes: Recent Labs  Lab 10/13/17 2147  TROPONINI <0.03   BNP (last 3 results) No results for input(s): PROBNP in the last 8760 hours. HbA1C: No results for input(s): HGBA1C in the last 72 hours. CBG: No results for input(s): GLUCAP in the last 168 hours. Lipid Profile: No results for input(s): CHOL, HDL, LDLCALC, TRIG, CHOLHDL, LDLDIRECT in the last 72 hours. Thyroid Function Tests: No results for input(s): TSH, T4TOTAL, FREET4, T3FREE, THYROIDAB in the last 72 hours. Anemia Panel: No results for input(s): VITAMINB12, FOLATE, FERRITIN, TIBC, IRON, RETICCTPCT in the last 72 hours. Urine analysis: No results found for: COLORURINE, APPEARANCEUR, LABSPEC, PHURINE, GLUCOSEU, HGBUR, BILIRUBINUR, KETONESUR, PROTEINUR, UROBILINOGEN, NITRITE, LEUKOCYTESUR Sepsis Labs: @LABRCNTIP (procalcitonin:4,lacticidven:4) )No results found for this or any previous  visit (from the past 240 hour(s)).   Radiological Exams on Admission: Dg Chest Port 1 View  Result Date: 10/13/2017 CLINICAL DATA:  Dizziness and chest pain. EXAM: PORTABLE CHEST 1 VIEW COMPARISON:  04/19/2005 chest radiograph FINDINGS: This is a low volume film. The cardiomediastinal silhouette is unremarkable. There is no evidence of focal airspace disease, pulmonary edema, suspicious pulmonary nodule/mass, pleural effusion, or pneumothorax. No acute bony abnormalities are identified. IMPRESSION: No active disease. Electronically Signed   By: Margarette Canada M.D.   On: 10/13/2017 22:19    EKG: Independently reviewed. Sinus tachycardia (rate 125), QTc 520 ms.   Assessment/Plan  1. Acute GI bleeding  - Pt presents with 2 days of rectal bleeding and intermittent abdominal pain, found to be hypotensive and tachycardic  - Hx of colonoscopy last year with polypectomy (tubular adenomas); no EGD report on file  - Takes Protonix daily, and also uses ASA 81 and Aleve daily   - Given 2 liters NS in ED and 3rd  liter started on admission; 2 units pRBCs ordered for immediate transfusion  - Unclear if upper or lower, given Protonix 40 mg IV in ED, will start on Protonix infusion for now  - Continue IVF, transfuse 2 units, check post-transfusion CBC, continue IV PPI  - GI is consulting and much appreciated   2. Hypotension; hx of hypertension  - Pt is hypotensive on arrival with BP 79/53, improved some after 2 L NS and now getting a 3rd liter   - Hold valsartan-HCTZ and Norvasc, continue IVF, transfuse RBCs as above, monitor in ICU   3. Type II DM  - No A1c on file - Managed at home with Levemir 10 units qHS, Invokana, and Januvia    - Plan to check CBGs and start a SSI with Novolog    4. Hyponatremia  - Serum sodium is 131 on admission in setting of hypovolemia  - Fluid-resuscitated with NS in ED and continue on NS infusion  - Repeat chem panel in am    5. Mild renal insufficiency  - SCr is 1.30 on admission with no prior available for comparison  - Could be an acute prerenal azotemia in setting of GI blood-loss with hypotension  - He has been fluid resuscitated in ED and will be continued on NS infusion  - Repeat chem panel in am    6. Prolonged QTc  - QTc is 520 ms on admission - Plan to continue cardiac monitoring, replace potassium to 4.0 and magnesium to 2.0, avoid offending agents where possible     DVT prophylaxis: SCD's  Code Status: Full  Family Communication: Wife updated at bedside Disposition Plan: Admit to ICU Consults called: Gastroenterology Admission status: Inpatient    Vianne Bulls, MD [facility] Pager [phone]  If 7PM-7AM, please contact night-coverage [email or web address] Password [email or web address]  10/13/2017, 11:54 PM

## 2017-10-13 NOTE — ED Notes (Signed)
AC notified of need for Protonix drip

## 2017-10-13 NOTE — ED Provider Notes (Signed)
[facility] EMERGENCY DEPARTMENT Provider Note   CSN: [id number] Arrival date & time: 10/13/17  2112     History   Chief Complaint Chief Complaint  Patient presents with  . GI Bleeding    HPI Victor Contreras is a 56 y.o. male.  Patient presenting with a complaint of dark bowel movements red blood in the commode water since Friday.  About 3-4 episodes a day.  Last evening started to feel dizzy.  This evening felt like he was going to pass out.  Patient followed by Dr. Melony Overly 2017 a colonoscopy for GI bleed at that time.  But did not find a source.  Patient not on blood thinners.  Patient has a history of diabetes hypertension and GERD.  Associated with this since Friday has been some epigastric abdominal pain and some pain between his shoulder blades.      Past Medical History:  Diagnosis Date  . Diabetes ([place])   . GERD (gastroesophageal reflux disease)   . Hypertension     Patient Active Problem List   Diagnosis Date Noted  . Essential hypertension 12/13/2015  . Diabetes ([place]) 12/13/2015    Past Surgical History:  Procedure Laterality Date  . APPENDECTOMY    . BACK SURGERY    . COLONOSCOPY N/A 01/12/2016   Procedure: COLONOSCOPY;  Surgeon: Rogene Houston, MD;  Location: [facility];  Service: Endoscopy;  Laterality: N/A;  12:00  . HERNIA REPAIR    . KNEE ARTHROSCOPY         Home Medications    Prior to Admission medications   Medication Sig Start Date End Date Taking? Authorizing Provider  amLODipine (NORVASC) 5 MG tablet Take 5 mg by mouth daily.    [provider]  canagliflozin (INVOKANA) 300 MG TABS tablet Take 300 mg by mouth daily before breakfast.    [provider]  cetirizine (ZYRTEC) 10 MG tablet Take 10 mg by mouth daily.    [provider]  fish oil-omega-3 fatty acids 1000 MG capsule Take 1 g by mouth daily.    [provider]  insulin detemir (LEVEMIR) 100 UNIT/ML injection Inject 10 Units into the skin daily.      [provider]  pantoprazole (PROTONIX) 40 MG tablet Take 40 mg by mouth daily.    [provider]  rosuvastatin (CRESTOR) 5 MG tablet Take 5 mg by mouth daily.    [provider]  sitaGLIPtin (JANUVIA) 100 MG tablet Take 100 mg by mouth daily.    [provider]  tamsulosin (FLOMAX) 0.4 MG CAPS capsule Take 0.4 mg by mouth.    [provider]  valsartan-hydrochlorothiazide (DIOVAN-HCT) 160-12.5 MG per tablet Take 1 tablet by mouth daily.    [provider]    Family History History reviewed. No pertinent family history.  Social History Social History   Tobacco Use  . Smoking status: Never Smoker  . Smokeless tobacco: Never Used  Substance Use Topics  . Alcohol use: Yes  . Drug use: No     Allergies   Patient has no known allergies.   Review of Systems Review of Systems  Constitutional: Negative for fever.  HENT: Negative for congestion.   Eyes: Negative for visual disturbance.  Respiratory: Negative for shortness of breath.   Cardiovascular: Positive for chest pain.  Gastrointestinal: Positive for abdominal pain and blood in stool. Negative for nausea and vomiting.  Genitourinary: Negative for dysuria.  Musculoskeletal: Positive for back pain.  Skin: Negative for  rash.  Neurological: Positive for dizziness. Negative for syncope.  Hematological: Does not bruise/bleed easily.  Psychiatric/Behavioral: Negative for confusion.     Physical Exam Updated Vital Signs BP (!) 90/48   Pulse (!) 109   Temp 98.5 F (36.9 C) (Oral)   Resp 18   Ht 1.753 m (5\' 9" )   Wt 117.9 kg (260 lb)   SpO2 100%   BMI 38.40 kg/m   Physical Exam  Constitutional: He is oriented to person, place, and time. He appears well-developed and well-nourished. He appears distressed.  HENT:  Head: Normocephalic and atraumatic.  Mouth/Throat: Oropharynx is clear and moist.  Eyes: Conjunctivae and EOM are normal. Pupils are equal, round,  and reactive to light.  Neck: Normal range of motion. Neck supple.  Cardiovascular:  Tachycardic  Pulmonary/Chest: Effort normal and breath sounds normal. No respiratory distress.  Abdominal: Soft. Bowel sounds are normal.  Genitourinary: Rectum normal.  Genitourinary Comments: Rectal exam with black maroon colored stool.  No bright red blood.  Musculoskeletal: Normal range of motion.  Neurological: He is alert and oriented to person, place, and time. No cranial nerve deficit or sensory deficit. He exhibits normal muscle tone. Coordination normal.  Skin: Skin is warm.  Nursing note and vitals reviewed.    ED Treatments / Results  Labs (all labs ordered are listed, but only abnormal results are displayed) Labs Reviewed  COMPREHENSIVE METABOLIC PANEL - Abnormal; Notable for the following components:      Result Value   Sodium 131 (*)    Potassium 3.0 (*)    Chloride 95 (*)    Glucose, Bld 256 (*)    BUN 23 (*)    Creatinine, Ser 1.30 (*)    GFR calc non Af Amer 60 (*)    All other components within normal limits  CBC WITH DIFFERENTIAL/PLATELET - Abnormal; Notable for the following components:   WBC 10.9 (*)    RBC 3.93 (*)    Hemoglobin 10.9 (*)    HCT 34.6 (*)    Lymphs Abs 4.1 (*)    All other components within normal limits  PROTIME-INR  LIPASE, BLOOD  TROPONIN I  POC OCCULT BLOOD, ED  TYPE AND SCREEN    EKG  EKG Interpretation  Date/Time:  Saturday October 13 2017 21:28:33 EST Ventricular Rate:  125 PR Interval:    QRS Duration: 91 QT Interval:  360 QTC Calculation: 520 R Axis:   37 Text Interpretation:  Sinus tachycardia Probable left atrial enlargement RSR' in V1 or V2, right VCD or RVH Nonspecific T abnrm, anterolateral leads Prolonged QT interval Baseline wander in lead(s) II III aVL aVF V3 V5 V6 No STEMI.  Reconfirmed by Fredia Sorrow [id number]) on 10/13/2017 9:48:32 PM       Radiology Dg Chest Port 1 View  Result Date: 10/13/2017 CLINICAL DATA:   Dizziness and chest pain. EXAM: PORTABLE CHEST 1 VIEW COMPARISON:  04/19/2005 chest radiograph FINDINGS: This is a low volume film. The cardiomediastinal silhouette is unremarkable. There is no evidence of focal airspace disease, pulmonary edema, suspicious pulmonary nodule/mass, pleural effusion, or pneumothorax. No acute bony abnormalities are identified. IMPRESSION: No active disease. Electronically Signed   By: Margarette Canada M.D.   On: 10/13/2017 22:19    Procedures Procedures (including critical care time)  CRITICAL CARE Performed by: Fredia Sorrow Total critical care time: 60 minutes Critical care time was exclusive of separately billable procedures and treating other patients. Critical care was necessary to treat or prevent imminent or life-threatening  deterioration. Critical care was time spent personally by me on the following activities: development of treatment plan with patient and/or surrogate as well as nursing, discussions with consultants, evaluation of patient's response to treatment, examination of patient, obtaining history from patient or surrogate, ordering and performing treatments and interventions, ordering and review of laboratory studies, ordering and review of radiographic studies, pulse oximetry and re-evaluation of patient's condition.   Medications Ordered in ED Medications  0.9 %  sodium chloride infusion (not administered)  sodium chloride 0.9 % bolus 2,000 mL (2,000 mLs Intravenous New Bag/Given 10/13/17 2158)  ondansetron (ZOFRAN) injection 4 mg (4 mg Intravenous Given 10/13/17 2203)  pantoprazole (PROTONIX) injection 40 mg (40 mg Intravenous Given 10/13/17 2202)  fentaNYL (SUBLIMAZE) injection 50 mcg (50 mcg Intravenous Given 10/13/17 2204)     Initial Impression / Assessment and Plan / ED Course  I have reviewed the triage vital signs and the nursing notes.  Pertinent labs & imaging results that were available during my care of the patient were reviewed by  me and considered in my medical decision making (see chart for details).    Patient with GI bleed suspect upper GI.  Patient's chest pain going to the shoulder blades was initially concerning but chest x-ray and troponins negative and that discomfort is been there since Friday so feel that this is not indicative of a dissection or myocardial infarction.  Patient received 2 L of IV fluids in the way of a bolus.  Brought his blood pressures up to around 90 and heart rate down to low 100s.  Patient also received fentanyl for the pain which brought the pain down to 2 out of 10.  Received Protonix.  And was typed and screened.  And then eventually typed and crossed for 2 units of blood.  Patient I believe is actively bleeding came in hemodynamically unstable.  Abdomen is distended but there is no acute surgical abdomen.   Chest x-ray as stated she was negative.  Patient's potassium is borderline at 3.0.  Patient is to receive 2 units of blood.  Contacted Dr. Melony Overly his GI doctor from the past.  He is aware.  He agrees with the plan.  Hospitalist will admit to ICU.  We will go ahead and order 1/3 L of fluid while were waiting for the blood transfusions.   Final Clinical Impressions(s) / ED Diagnoses   Final diagnoses:  Gastrointestinal hemorrhage, unspecified gastrointestinal hemorrhage type  Hypotension, unspecified hypotension type    ED Discharge Orders    None       Fredia Sorrow, MD 10/13/17 2329

## 2017-10-13 NOTE — ED Notes (Addendum)
Victor Contreras, in lab reports pt blood screen for antibodies came back positive- unknown when blood will be ready. Dr Blima Singer and Dr Rogene Houston made aware.

## 2017-10-14 ENCOUNTER — Inpatient Hospital Stay (HOSPITAL_COMMUNITY): Payer: 59

## 2017-10-14 ENCOUNTER — Encounter (HOSPITAL_COMMUNITY): Payer: Self-pay | Admitting: *Deleted

## 2017-10-14 ENCOUNTER — Encounter (HOSPITAL_COMMUNITY)
Admission: EM | Disposition: A | Discharge status: Home / Self Care | Payer: Self-pay | Source: Home / Self Care | Attending: Internal Medicine

## 2017-10-14 DIAGNOSIS — Z794 Long term (current) use of insulin: Secondary | ICD-10-CM

## 2017-10-14 DIAGNOSIS — N179 Acute kidney failure, unspecified: Secondary | ICD-10-CM

## 2017-10-14 DIAGNOSIS — K449 Diaphragmatic hernia without obstruction or gangrene: Secondary | ICD-10-CM

## 2017-10-14 DIAGNOSIS — D62 Acute posthemorrhagic anemia: Secondary | ICD-10-CM

## 2017-10-14 DIAGNOSIS — I959 Hypotension, unspecified: Secondary | ICD-10-CM

## 2017-10-14 DIAGNOSIS — K921 Melena: Secondary | ICD-10-CM

## 2017-10-14 DIAGNOSIS — K228 Other specified diseases of esophagus: Secondary | ICD-10-CM

## 2017-10-14 DIAGNOSIS — E118 Type 2 diabetes mellitus with unspecified complications: Secondary | ICD-10-CM

## 2017-10-14 DIAGNOSIS — E871 Hypo-osmolality and hyponatremia: Secondary | ICD-10-CM

## 2017-10-14 HISTORY — PX: ESOPHAGOGASTRODUODENOSCOPY: SHX5428

## 2017-10-14 LAB — GLUCOSE, CAPILLARY
GLUCOSE-CAPILLARY: 142 mg/dL — AB (ref 65–99)
GLUCOSE-CAPILLARY: 130 mg/dL — AB (ref 65–99)
GLUCOSE-CAPILLARY: 96 mg/dL (ref 65–99)
Glucose-Capillary: 148 mg/dL — ABNORMAL HIGH (ref 65–99)

## 2017-10-14 LAB — CBC
HCT: 34.2 % — ABNORMAL LOW (ref 39.0–52.0)
HEMOGLOBIN: 10.8 g/dL — AB (ref 13.0–17.0)
MCH: 28.1 pg (ref 26.0–34.0)
MCHC: 31.6 g/dL (ref 30.0–36.0)
MCV: 88.8 fL (ref 78.0–100.0)
PLATELETS: 237 10*3/uL (ref 150–400)
RBC: 3.85 MIL/uL — AB (ref 4.22–5.81)
RDW: 15.2 % (ref 11.5–15.5)
WBC: 8.3 10*3/uL (ref 4.0–10.5)

## 2017-10-14 LAB — BASIC METABOLIC PANEL
ANION GAP: 6 (ref 5–15)
BUN: 20 mg/dL (ref 6–20)
CALCIUM: 8.8 mg/dL — AB (ref 8.9–10.3)
CO2: 26 mmol/L (ref 22–32)
Chloride: 104 mmol/L (ref 101–111)
Creatinine, Ser: 0.92 mg/dL (ref 0.61–1.24)
GFR calc Af Amer: >60 mL/min (ref >60)
Glucose, Bld: 142 mg/dL — ABNORMAL HIGH (ref 65–99)
POTASSIUM: 3.4 mmol/L — AB (ref 3.5–5.1)
SODIUM: 136 mmol/L (ref 135–145)

## 2017-10-14 LAB — HEMOGLOBIN AND HEMATOCRIT, BLOOD
HEMATOCRIT: 32.8 % — AB (ref 39.0–52.0)
HEMOGLOBIN: 10.3 g/dL — AB (ref 13.0–17.0)

## 2017-10-14 LAB — MAGNESIUM: Magnesium: 2.4 mg/dL (ref 1.7–2.4)

## 2017-10-14 LAB — MRSA PCR SCREENING: MRSA BY PCR: NEGATIVE

## 2017-10-14 SURGERY — EGD (ESOPHAGOGASTRODUODENOSCOPY)
Anesthesia: Moderate Sedation

## 2017-10-14 MED ORDER — LIDOCAINE VISCOUS 2 % MT SOLN
OROMUCOSAL | Status: AC
  Filled 2017-10-14: qty 15

## 2017-10-14 MED ORDER — MIDAZOLAM HCL 5 MG/5ML IJ SOLN
INTRAMUSCULAR | Status: AC
  Filled 2017-10-14: qty 10

## 2017-10-14 MED ORDER — SODIUM CHLORIDE 0.9 % IV SOLN: INTRAVENOUS | Status: DC

## 2017-10-14 MED ORDER — PANTOPRAZOLE SODIUM 40 MG IV SOLR
INTRAVENOUS | Status: AC
  Filled 2017-10-14: qty 80

## 2017-10-14 MED ORDER — MEPERIDINE HCL 50 MG/ML IJ SOLN
INTRAMUSCULAR | Status: DC | PRN
  Administered 2017-10-14 (×2): 25 mg via INTRAVENOUS

## 2017-10-14 MED ORDER — LIDOCAINE VISCOUS 2 % MT SOLN
OROMUCOSAL | Status: DC | PRN
  Administered 2017-10-14: 4 mL via OROMUCOSAL

## 2017-10-14 MED ORDER — MIDAZOLAM HCL 5 MG/5ML IJ SOLN
INTRAMUSCULAR | Status: DC | PRN
  Administered 2017-10-14: 2 mg via INTRAVENOUS
  Administered 2017-10-14: 1 mg via INTRAVENOUS
  Administered 2017-10-14 (×2): 2 mg via INTRAVENOUS

## 2017-10-14 MED ORDER — MEPERIDINE HCL 50 MG/ML IJ SOLN
INTRAMUSCULAR | Status: AC
  Filled 2017-10-14: qty 1

## 2017-10-14 MED ORDER — PNEUMOCOCCAL VAC POLYVALENT 25 MCG/0.5ML IJ INJ
0.5000 mL | INJECTION | INTRAMUSCULAR | Status: AC
  Administered 2017-10-15: 0.5 mL via INTRAMUSCULAR
  Filled 2017-10-14: qty 0.5

## 2017-10-14 MED ORDER — STERILE WATER FOR IRRIGATION IR SOLN
Status: DC | PRN
  Administered 2017-10-14: 13:00:00

## 2017-10-14 MED ORDER — LORAZEPAM 2 MG/ML IJ SOLN
0.5000 mg | INTRAMUSCULAR | Status: DC | PRN
  Administered 2017-10-14 (×2): 0.5 mg via INTRAVENOUS
  Filled 2017-10-14 (×2): qty 1

## 2017-10-14 MED ORDER — IOPAMIDOL (ISOVUE-370) INJECTION 76%
100.0000 mL | Freq: Once | INTRAVENOUS | Status: AC | PRN
  Administered 2017-10-14: 100 mL via INTRAVENOUS

## 2017-10-14 NOTE — Plan of Care (Signed)
Patient will have a understanding of disease process, treatments, new medications and tests ordered during the shift.

## 2017-10-14 NOTE — Progress Notes (Addendum)
Chest and abdominal pelvic CT negative for aneurysm or dissection. Shows fatty liver and mesh and abdominal wall but no hernia. Will proceed with diagnostic EGD.   Dr. Kathlene Cote contacted me additional liver findings.  He has small area of enhancement towards the left lobe.  He recommended MRI of liver to further evaluate this lesion. Plan MRI when GI bleeding evaluation complete.

## 2017-10-14 NOTE — Op Note (Signed)
[facility] Patient Name: Treyton Slimp Procedure Date: 10/14/2017 12:54 PM MRN: [id number] Date of Birth: 05/25/1961 Attending MD: Hildred Laser , MD CSN: [id number] Age: 56 Admit Type: Inpatient Procedure:                Upper GI endoscopy Indications:              Acute post hemorrhagic anemia, Melena Providers:                Hildred Laser, MD, Janeece Riggers, RN, Jeanann Lewandowsky. Sharon Seller, RN Referring MD:             Kathie Dike, MD Medicines:                Lidocaine spray, Meperidine 50 mg IV, Midazolam 7                            mg IV Complications:            No immediate complications. Estimated Blood Loss:     Estimated blood loss: none. Procedure:                Pre-Anesthesia Assessment:                           - Prior to the procedure, a History and Physical                            was performed, and patient medications and                            allergies were reviewed. The patient's tolerance of                            previous anesthesia was also reviewed. The risks                            and benefits of the procedure and the sedation                            options and risks were discussed with the patient.                            All questions were answered, and informed consent                            was obtained. Prior Anticoagulants: The patient                            last took aspirin 2 days prior to the procedure.                            ASA Grade Assessment: II - A patient with mild  systemic disease. After reviewing the risks and                            benefits, the patient was deemed in satisfactory                            condition to undergo the procedure.                           After obtaining informed consent, the endoscope was                            passed under direct vision. Throughout the                            procedure, the patient's blood  pressure, pulse, and                            oxygen saturations were monitored continuously. The                            [id number]) was introduced through the mouth,                            and advanced to the second part of duodenum. The                            upper GI endoscopy was technically difficult and                            complex due to the patient's inability to cooperate                            and the patient's position intolerance. Successful                            completion of the procedure was aided by increasing                            the dose of sedation medication. Scope In: 1:21:11 PM Scope Out: 1:30:22 PM Total Procedure Duration: 0 hours 9 minutes 11 seconds  Findings:      The examined esophagus was normal.      The Z-line was irregular and was found 41 cm from the incisors.      A 2 cm hiatal hernia was present.      The entire examined stomach was normal.      The duodenal bulb and second portion of the duodenum were normal. Impression:               - Normal esophagus.                           - Z-line irregular, 41 cm from the incisors.                           -  2 cm hiatal hernia.                           - Normal stomach.                           - Normal duodenal bulb and second portion of the                            duodenum.                           - No specimens collected.                           Comment: No bleeding lesion identifired. Moderate Sedation:      Moderate (conscious) sedation was administered by the endoscopy nurse       and supervised by the endoscopist. The following parameters were       monitored: oxygen saturation, heart rate, blood pressure, CO2       capnography and response to care. Total physician intraservice time was       9 minutes. Recommendation:           - Return patient to ICU for ongoing care.                           - Clear liquid diet today.                            - Continue present medications.                           - Patency capsule tomorrow prior to Given capsule                            study.                           - Future EGDs with MAC. Procedure Code(s):        --- Professional ---                           [phone], Esophagogastroduodenoscopy, flexible,                            transoral; diagnostic, including collection of                            specimen(s) by brushing or washing, when performed                            (separate procedure) Diagnosis Code(s):        --- Professional ---                           K22.8, Other specified diseases of esophagus  K44.9, Diaphragmatic hernia without obstruction or                            gangrene                           D62, Acute posthemorrhagic anemia                           K92.1, Melena (includes Hematochezia) CPT copyright 2016 American Medical Association. All rights reserved. The codes documented in this report are preliminary and upon coder review may  be revised to meet current compliance requirements. Hildred Laser, MD Hildred Laser, MD 10/14/2017 1:57:42 PM This report has been signed electronically. Number of Addenda: 0

## 2017-10-14 NOTE — Progress Notes (Signed)
PROGRESS NOTE    Victor Contreras  MVH:[id number] DOB: [birth year] DOA: 10/13/2017 PCP: Monico Blitz, MD    Brief Narrative:  56 year old male presents to the hospital with abdominal pain, rectal bleeding and lightheadedness.  He was taking regular NSAIDs and aspirin.  Noted to be hypotensive in the emergency room.  Started on Protonix infusion, IV fluids and received 2 units of PRBCs.  He was admitted for further management of acute GI bleeding.   Assessment & Plan:   Principal Problem:   Gastrointestinal hemorrhage Active Problems:   Essential hypertension   Diabetes (HCC)   Hyponatremia   Mild renal insufficiency   1. Acute GI bleeding.  Patient presented with 2 days of rectal bleeding and intermittent abdominal pain.  He admits to taking regular Aleve, aspirin.  Started on Protonix infusion.  Gastroenterology following.  Since he is having significant abdominal pain, CT imaging has been ordered.  If this is unremarkable, he will likely undergo EGD later today. 2. Hypotension.  Patient arrived with a blood pressure of 79/53.  He received IV fluids.  Antihypertensives were held.  Overall blood pressure has improved with volume resuscitation. 3. Acute blood loss anemia.  Baseline hemoglobin is unclear since the prior labs from 2012 show hemoglobin of 15.3.  He presented with a hemoglobin of 10.9.  Since he was acutely bleeding and was hypotensive, he was transfused 2 units of PRBCs.  Follow-up hemoglobin pending. 4. Diabetes.  Hold oral agents.  Started on sliding scale.  Follow blood sugars. 5. Acute kidney injury.  Likely related to volume depletion and hypotension.  Improved with IV fluids. 6. Hyponatremia.  Likely related to hypovolemia.  Improved after receiving IV hydration.   DVT prophylaxis: SCDs Code Status: Full code Family Communication: Discussed with wife at the bedside Disposition Plan: Discharge home once improved   Consultants:   Gastroenterology  Procedures:      Antimicrobials:      Subjective: Left upper quadrant pain and neck pain have improved.  No nausea or vomiting.  Objective: Vitals:   10/14/17 0825 10/14/17 0830 10/14/17 0845 10/14/17 0900  BP: 136/74 (!) 126/46 118/67 127/75  Pulse: (!) 104 (!) 104 100 (!) 102  Resp: 14 17 18 20   Temp: 98.5 F (36.9 C)     TempSrc: Oral     SpO2: 99% 99% 100% 98%  Weight:      Height:        Intake/Output Summary (Last 24 hours) at 10/14/2017 0920 Last data filed at 10/14/2017 0915 Gross per 24 hour  Intake 3344 ml  Output 1200 ml  Net 2144 ml   Filed Weights   10/13/17 2118 10/14/17 0039 10/14/17 0500  Weight: 117.9 kg (260 lb) 121.8 kg (268 lb 8.3 oz) 121.8 kg (268 lb 8.3 oz)    Examination:  General exam: Appears calm and comfortable  Respiratory system: Clear to auscultation. Respiratory effort normal. Cardiovascular system: S1 & S2 heard, RRR. No JVD, murmurs, rubs, gallops or clicks. No pedal edema. Gastrointestinal system: Abdomen is nondistended, soft and nontender. No organomegaly or masses felt. Normal bowel sounds heard. Central nervous system: Alert and oriented. No focal neurological deficits. Extremities: Symmetric 5 x 5 power. Skin: No rashes, lesions or ulcers Psychiatry: Judgement and insight appear normal. Mood & affect appropriate.     Data Reviewed: I have personally reviewed following labs and imaging studies  CBC: Recent Labs  Lab 10/13/17 2147 10/13/17 2351  WBC 10.9*  --   NEUTROABS 5.8  --  HGB 10.9* 10.2*  HCT 34.6* 32.1*  MCV 88.0  --   PLT 265  --    Basic Metabolic Panel: Recent Labs  Lab 10/13/17 2147 10/14/17 0414  NA 131* 136  K 3.0* 3.4*  CL 95* 104  CO2 23 26  GLUCOSE 256* 142*  BUN 23* 20  CREATININE 1.30* 0.92  CALCIUM 9.3 8.8*  MG  --  2.4   GFR: Estimated Creatinine Clearance: 115.5 mL/min (by C-G formula based on SCr of 0.92 mg/dL). Liver Function Tests: Recent Labs  Lab 10/13/17 2147  AST 34  ALT 21   ALKPHOS 40  BILITOT 0.6  PROT 7.0  ALBUMIN 3.9   Recent Labs  Lab 10/13/17 2147  LIPASE 42   No results for input(s): AMMONIA in the last 168 hours. Coagulation Profile: Recent Labs  Lab 10/13/17 2147  INR 1.07   Cardiac Enzymes: Recent Labs  Lab 10/13/17 2147  TROPONINI <0.03   BNP (last 3 results) No results for input(s): PROBNP in the last 8760 hours. HbA1C: No results for input(s): HGBA1C in the last 72 hours. CBG: Recent Labs  Lab 10/14/17 0002 10/14/17 0415 10/14/17 0755  GLUCAP 205* 142* 148*   Lipid Profile: No results for input(s): CHOL, HDL, LDLCALC, TRIG, CHOLHDL, LDLDIRECT in the last 72 hours. Thyroid Function Tests: No results for input(s): TSH, T4TOTAL, FREET4, T3FREE, THYROIDAB in the last 72 hours. Anemia Panel: No results for input(s): VITAMINB12, FOLATE, FERRITIN, TIBC, IRON, RETICCTPCT in the last 72 hours. Sepsis Labs: No results for input(s): PROCALCITON, LATICACIDVEN in the last 168 hours.  Recent Results (from the past 240 hour(s))  MRSA PCR Screening     Status: None   Collection Time: 10/14/17 12:25 AM  Result Value Ref Range Status   MRSA by PCR NEGATIVE NEGATIVE Final    Comment:        The GeneXpert MRSA Assay (FDA approved for NASAL specimens only), is one component of a comprehensive MRSA colonization surveillance program. It is not intended to diagnose MRSA infection nor to guide or monitor treatment for MRSA infections.          Radiology Studies: Dg Chest Port 1 View  Result Date: 10/13/2017 CLINICAL DATA:  Dizziness and chest pain. EXAM: PORTABLE CHEST 1 VIEW COMPARISON:  04/19/2005 chest radiograph FINDINGS: This is a low volume film. The cardiomediastinal silhouette is unremarkable. There is no evidence of focal airspace disease, pulmonary edema, suspicious pulmonary nodule/mass, pleural effusion, or pneumothorax. No acute bony abnormalities are identified. IMPRESSION: No active disease. Electronically Signed    By: Margarette Canada M.D.   On: 10/13/2017 22:19        Scheduled Meds: . insulin aspart  0-9 Units Subcutaneous Q4H  . [START ON 10/17/2017] pantoprazole  40 mg Intravenous Q12H  . pantoprazole (PROTONIX) IV  40 mg Intravenous Once  . [START ON 10/15/2017] pneumococcal 23 valent vaccine  0.5 mL Intramuscular Tomorrow-1000  . sodium chloride flush  3 mL Intravenous Q12H   Continuous Infusions: . 0.9 % NaCl with KCl 40 mEq / L 125 mL/hr (10/14/17 0030)  . pantoprozole (PROTONIX) infusion 8 mg/hr (10/14/17 0600)     LOS: 1 day    Time spent: 25 mins    Kathie Dike, MD [facility] Pager [phone]  If 7PM-7AM, please contact night-coverage [email or web address] Password [email or web address] 10/14/2017, 9:20 AM

## 2017-10-14 NOTE — Progress Notes (Addendum)
Brief EGD note.   Small sliding hiatal hernia otherwise normal EGD.  Please note patient did not tolerate conscious sedation well. Future EGDs to be done with monitored anesthesia care.

## 2017-10-14 NOTE — Consult Note (Signed)
Referring Provider: Kathie Dike, MD Primary Care Physician:  Monico Blitz, MD Primary Gastroenterologist:  Dr. Laural Golden  Reason for Consultation:    GI bleed.  HPI:   Patient is 56 year old F American male who has a history of remote GI bleed without a definite diagnosis who was in usual state of health until 3 days ago.  He felt fine when he woke up on 10/11/2017.  He had a bowel movement his stool was black.  Water did color light pink.  It had order and it reminded him the bleed that he had 11 years ago.  However he did not have any other symptoms and he did not feel bad at all.  So he decided to wait and see.  2 days ago he had black stool but was loose.  He was seen by Dr. Manuella Ghazi his primary care physician.  He was hemodynamically stable.  He had lab studies drawn.  He felt fine rest of the day.  Yesterday for stool is black but second 1 was not as black and he thought he is stop bleeding.  Around 7 PM yesterday he became dizzy and he thought he was going to pass out.  He noted pain in precordial region and left upper quadrant as well as neck and back.  He also developed diaphoresis and nausea.  He did not vomit. He was brought to emergency room.  He was tachycardic and hypotensive.  He responded to IV fluids.  Rectal exam by Dr. Rogene Houston revealed maroonish blood/stool but no bright red blood was noted.  Patient was admitted to ICU.  He received a unit of PRBCs.  He is receiving second unit of PRBCs this morning.  He has been passing flatus.  He has not had any more bloody bowel movements.  He complains of mild discomfort in left upper quadrant.  He denies chest pain or shortness of breath.  He has had good appetite and has not lost any weight. His troponin level was normal and chest film was unremarkable. Patient states he has chronic back pain and takes Aleve on most days but no more than 1 tablet daily.  He also is on low-dose aspirin.  Patient states he was hospitalized at [facility] 11 years ago  with GI bleed.  He had more or less similar episode.  He underwent colonoscopy but no bleeding lesions identified.  He stopped bleeding and did not undergo any further evaluation. He states heartburn is well controlled with PPI.  He had colonoscopy by me in March last year for heme positive stool.  His hemoglobin was over 14 g.  He had 3 polyps removed and these are tubular adenomas.  He did not have colonic diverticulosis.  He has never been married but he has been stable relationship for almost 30 years.  His girlfriend Tammy is at bedside.  He does not have any children.  He drinks alcohol occasionally.  He quit cigarette smoking 15 years ago.  He smoked about a pack a day for 20 years. 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  Family history significant for melanoma in his father who is 29 and he died.  Mother is 38 years old in good health.  His brother died of triple ruptured a in late 30s.  He has 4 brothers and 2 sisters in good health.   Past Medical History:  Diagnosis Date  . Diabetes ([place])   . GERD (gastroesophageal reflux disease)   . Hypertension  Obesity.      GERD.       Hyperlipidemia.       Small bowel obstruction for which she was hospitalized twice and responded to medical therapy.  He was felt to have obstruction secondary to adhesions.  Last admission was over 10 years ago.       History of GI bleed 11 years ago for which she was treated at [facility] in        [place], [place] and bleeding site not determined.  He did undergo colonoscopy at that time.       3 of colonic adenomas.  Last colonoscopy was in March 2017 with removal of 3 tubular adenomas.  Past Surgical History:  Procedure Laterality Date  . APPENDECTOMY    . BACK SURGERY    . COLONOSCOPY N/A 01/12/2016   Procedure: COLONOSCOPY;  Surgeon: Rogene Houston, MD;  Location: [facility] END[facility] SUITE;  Service: Endoscopy;  Laterality: N/A;  12:00  . HERNIA REPAIR    . KNEE ARTHROSCOPY      Prior  to Admission medications   Medication Sig Start Date End Date Taking? Authorizing Provider  amLODipine (NORVASC) 5 MG tablet Take 5 mg by mouth daily.    [provider]  canagliflozin (INVOKANA) 300 MG TABS tablet Take 300 mg by mouth daily before breakfast.    [provider]  cetirizine (ZYRTEC) 10 MG tablet Take 10 mg by mouth daily.    [provider]  fish oil-omega-3 fatty acids 1000 MG capsule Take 1 g by mouth daily.    [provider]  insulin detemir (LEVEMIR) 100 UNIT/ML injection Inject 10 Units into the skin daily.     [provider]  pantoprazole (PROTONIX) 40 MG tablet Take 40 mg by mouth daily.    [provider]  rosuvastatin (CRESTOR) 5 MG tablet Take 5 mg by mouth daily.    [provider]  sitaGLIPtin (JANUVIA) 100 MG tablet Take 100 mg by mouth daily.    [provider]  tamsulosin (FLOMAX) 0.4 MG CAPS capsule Take 0.4 mg by mouth.    [provider]  valsartan-hydrochlorothiazide (DIOVAN-HCT) 160-12.5 MG per tablet Take 1 tablet by mouth daily.    [provider]   Aspirin 81 mg p.o. Daily. Aleve 220 mg p.o. daily as needed.   Current Facility-Administered Medications  Medication Dose Route Frequency Provider Last Rate Last Dose  . 0.9 % NaCl with KCl 40 mEq / L  infusion   Intravenous Continuous Vianne Bulls, MD 125 mL/hr at 10/14/17 0030 125 mL/hr at 10/14/17 0030  . acetaminophen (TYLENOL) tablet 650 mg  650 mg Oral Q6H PRN Opyd, Ilene Qua, MD       Or  . acetaminophen (TYLENOL) suppository 650 mg  650 mg Rectal Q6H PRN Opyd, Ilene Qua, MD      . fentaNYL (SUBLIMAZE) injection 25-50 mcg  25-50 mcg Intravenous Q2H PRN Opyd, Ilene Qua, MD      . insulin aspart (novoLOG) injection 0-9 Units  0-9 Units Subcutaneous Q4H Opyd, Ilene Qua, MD   1 Units at 10/14/17 0804  . LORazepam (ATIVAN) injection 0.5 mg  0.5 mg Intravenous Q4H PRN Opyd, Ilene Qua, MD   0.5 mg at 10/14/17 0353   . pantoprazole (PROTONIX) 80 mg in sodium chloride 0.9 % 250 mL (0.32 mg/mL) infusion  8 mg/hr Intravenous Continuous Opyd, Ilene Qua, MD 25 mL/hr at 10/14/17 0600 8 mg/hr at 10/14/17 0600  . [START ON  10/17/2017] pantoprazole (PROTONIX) injection 40 mg  40 mg Intravenous Q12H Opyd, Ilene Qua, MD      . pantoprazole (PROTONIX) injection 40 mg  40 mg Intravenous Once Opyd, Ilene Qua, MD      . Derrill Memo ON 10/15/2017] pneumococcal 23 valent vaccine (PNU-IMMUNE) injection 0.5 mL  0.5 mL Intramuscular Tomorrow-1000 Memon, Jehanzeb, MD      . sodium chloride flush (NS) 0.9 % injection 3 mL  3 mL Intravenous Q12H Opyd, Ilene Qua, MD        Allergies as of 10/13/2017  . (No Known Allergies)    Family History  Problem Relation Age of Onset  . Sudden Cardiac Death Neg Hx     Social History   Socioeconomic History  . Marital status: Single    Spouse name: Not on file  . Number of children: Not on file  . Years of education: Not on file  . Highest education level: Not on file  Social Needs  . Financial resource strain: Not on file  . Food insecurity - worry: Not on file  . Food insecurity - inability: Not on file  . Transportation needs - medical: Not on file  . Transportation needs - non-medical: Not on file  Occupational History  . Not on file  Tobacco Use  . Smoking status: Never Smoker  . Smokeless tobacco: Never Used  Substance and Sexual Activity  . Alcohol use: Yes  . Drug use: No  . Sexual activity: Not on file  Other Topics Concern  . Not on file  Social History Narrative  . Not on file    Review of Systems: See HPI, otherwise normal ROS  Physical Exam: Temp:  [98.2 F (36.8 C)-98.6 F (37 C)] 98.5 F (36.9 C) (12/02 0825) Pulse Rate:  [61-131] 104 (12/02 0825) Resp:  [14-24] 14 (12/02 0825) BP: (79-136)/(41-81) 136/74 (12/02 0825) SpO2:  [82 %-100 %] 99 % (12/02 0825) Weight:  [260 lb (117.9 kg)-268 lb 8.3 oz (121.8 kg)] 268 lb 8.3 oz (121.8 kg) (12/02  0500) Last BM Date: 10/13/17   Well-developed obese African-American male in no acute distress. Conjunctiva is pale.  Sclerae nonicteric. Oropharyngeal mucosa is normal.  Dentition in satisfactory condition. No neck masses or thyromegaly noted. No carotid bruits noted. Cardiac exam with a regular rhythm normal S1 and S2.  No murmur or gallop noted. Lungs are clear to auscultation. Abdomen is obese.  He has along the right paramedian scar.  Bowel sounds are normal.  No bruit noted.  On palpation abdomen is soft and nontender without organomegaly or masses. Peripheral edema or clubbing noted.     Lab Results: Recent Labs    10/13/17 2147 10/13/17 2351  WBC 10.9*  --   HGB 10.9* 10.2*  HCT 34.6* 32.1*  PLT 265  --    BMET Recent Labs    10/13/17 2147 10/14/17 0414  NA 131* 136  K 3.0* 3.4*  CL 95* 104  CO2 23 26  GLUCOSE 256* 142*  BUN 23* 20  CREATININE 1.30* 0.92  CALCIUM 9.3 8.8*   LFT Recent Labs    10/13/17 2147  PROT 7.0  ALBUMIN 3.9  AST 34  ALT 21  ALKPHOS 40  BILITOT 0.6   PT/INR Recent Labs    10/13/17 2147  LABPROT 13.8  INR 1.07    Studies/Results: Dg Chest Port 1 View  Result Date: 10/13/2017 CLINICAL DATA:  Dizziness and chest pain. EXAM: PORTABLE CHEST 1 VIEW COMPARISON:  04/19/2005 chest  radiograph FINDINGS: This is a low volume film. The cardiomediastinal silhouette is unremarkable. There is no evidence of focal airspace disease, pulmonary edema, suspicious pulmonary nodule/mass, pleural effusion, or pneumothorax. No acute bony abnormalities are identified. IMPRESSION: No active disease. Electronically Signed   By: Margarette Canada M.D.   On: 10/13/2017 22:19    Assessment;  Patient is a 56 year old African-American male who has a history of GI bleed 11 years ago without identification of source who presents with GI bleed which started 2 days prior to admission.  He started with melena.  Yesterday he passed burgundy stool and experienced  postural symptoms as well as excruciating precordial neck and left upper quadrant abdominal pain.  Patient is receiving his second unit of PRBCs. Given his history and history of AAA and his brother need to rule out aortic aneurysm/to enteric fistula before considering endoscopic evaluation. He may well have peptic ulcer disease given that he is on low-dose aspirin and takes Aleve on most days. If small bowel evaluation is indicated gut patency will have to be checked first before patient given given capsule because of history of small bowel obstruction due to adhesions.  Patient is on IV pantoprazole at a dose of 40 mg every 12 hours.  Recommendations;   Chest and abdominal pelvic CT with IV contrast to rule out aortic aneurysm given history of excruciating left-sided chest and abdominal pain. Diagnostic  esophagogastroduodenoscopy once AAA ruled out. If EGD is unremarkable will check small bowel patency with Agile capsule before considering small bowel given capsule study.   LOS: 1 day   Wisdom Rickey  10/14/2017, 8:49 AM

## 2017-10-15 ENCOUNTER — Inpatient Hospital Stay (HOSPITAL_COMMUNITY): Payer: 59

## 2017-10-15 ENCOUNTER — Encounter (HOSPITAL_COMMUNITY)
Admission: EM | Disposition: A | Discharge status: Home / Self Care | Payer: Self-pay | Source: Home / Self Care | Attending: Internal Medicine

## 2017-10-15 DIAGNOSIS — K922 Gastrointestinal hemorrhage, unspecified: Secondary | ICD-10-CM

## 2017-10-15 DIAGNOSIS — I10 Essential (primary) hypertension: Secondary | ICD-10-CM

## 2017-10-15 HISTORY — PX: AGILE CAPSULE: SHX5420

## 2017-10-15 LAB — BASIC METABOLIC PANEL
Anion gap: 6 (ref 5–15)
BUN: 10 mg/dL (ref 6–20)
CALCIUM: 8.4 mg/dL — AB (ref 8.9–10.3)
CHLORIDE: 102 mmol/L (ref 101–111)
CO2: 27 mmol/L (ref 22–32)
CREATININE: 0.64 mg/dL (ref 0.61–1.24)
Glucose, Bld: 108 mg/dL — ABNORMAL HIGH (ref 65–99)
Potassium: 3.8 mmol/L (ref 3.5–5.1)
SODIUM: 135 mmol/L (ref 135–145)

## 2017-10-15 LAB — CBC
HCT: 34.3 % — ABNORMAL LOW (ref 39.0–52.0)
Hemoglobin: 10.7 g/dL — ABNORMAL LOW (ref 13.0–17.0)
MCH: 27.9 pg (ref 26.0–34.0)
MCHC: 31.2 g/dL (ref 30.0–36.0)
MCV: 89.6 fL (ref 78.0–100.0)
PLATELETS: 265 10*3/uL (ref 150–400)
RBC: 3.83 MIL/uL — ABNORMAL LOW (ref 4.22–5.81)
RDW: 15.3 % (ref 11.5–15.5)
WBC: 7.6 10*3/uL (ref 4.0–10.5)

## 2017-10-15 LAB — GLUCOSE, CAPILLARY
GLUCOSE-CAPILLARY: 122 mg/dL — AB (ref 65–99)
GLUCOSE-CAPILLARY: 105 mg/dL — AB (ref 65–99)
Glucose-Capillary: 107 mg/dL — ABNORMAL HIGH (ref 65–99)
Glucose-Capillary: 112 mg/dL — ABNORMAL HIGH (ref 65–99)
Glucose-Capillary: 116 mg/dL — ABNORMAL HIGH (ref 65–99)
Glucose-Capillary: 104 mg/dL — ABNORMAL HIGH (ref 65–99)
Glucose-Capillary: 103 mg/dL — ABNORMAL HIGH (ref 65–99)

## 2017-10-15 LAB — HIV ANTIBODY (ROUTINE TESTING W REFLEX): HIV Screen 4th Generation wRfx: NONREACTIVE

## 2017-10-15 SURGERY — AGILE CAPSULE

## 2017-10-15 MED ORDER — PANTOPRAZOLE SODIUM 40 MG IV SOLR
40.0000 mg | Freq: Every day | INTRAVENOUS | Status: DC
  Administered 2017-10-15 – 2017-10-16 (×2): 40 mg via INTRAVENOUS
  Filled 2017-10-15 (×2): qty 40

## 2017-10-15 NOTE — Progress Notes (Signed)
Patient is alert and oriented. Vital signs are stable. IV patent. No complaints of pain or any distress. Report called to Quillian Quince, RN and he verbalized understanding of report. Patient is to be transferred to room 332 via wheelchair accompanied with family and nursing staff.

## 2017-10-15 NOTE — Progress Notes (Signed)
PROGRESS NOTE    Victor Contreras  SAY:[id number] DOB: 03-18-1961 DOA: 10/13/2017 PCP: Monico Blitz, MD    Brief Narrative:  56 year old male presents to the hospital with abdominal pain, rectal bleeding and lightheadedness.  He was taking regular NSAIDs and aspirin.  Noted to be hypotensive in the emergency room.  Started on Protonix infusion, IV fluids and received 2 units of PRBCs.  He was admitted for further management of acute GI bleeding.   Assessment & Plan:   Principal Problem:   Gastrointestinal hemorrhage Active Problems:   Essential hypertension   Diabetes (HCC)   Hyponatremia   Mild renal insufficiency   Acute posthemorrhagic anemia   1. Acute GI bleeding.  Patient presented with 2 days of rectal bleeding and intermittent abdominal pain.  He admitted to taking regular Aleve, aspirin.  Started on Protonix.  Gastroenterology following.  CT imaging did not indicate any aortic dissection or aneurysm.  EGD was unrevealing.  GI following and preparing for capsule study.  It does not appear he is having further bleeding at this time.. 2. Hypotension.  Patient arrived with a blood pressure of 79/53.  He received IV fluids.  Antihypertensives were held.  Overall blood pressure has improved with volume resuscitation. 3. Acute blood loss anemia.  Baseline hemoglobin is unclear since the prior labs from 2012 show hemoglobin of 15.3.  He presented with a hemoglobin of 10.9.  Since he was acutely bleeding and was hypotensive, he was transfused 2 units of PRBCs.  Follow-up hemoglobin has remained stable. 4. Diabetes.  Hold oral agents.  Continue on sliding scale.  Blood sugars have been stable 5. Acute kidney injury.  Likely related to volume depletion and hypotension.  Improved with IV fluids. 6. Hyponatremia.  Likely related to hypovolemia.  Improved after receiving IV hydration.   DVT prophylaxis: SCDs Code Status: Full code Family Communication: Discussed with wife at the  bedside Disposition Plan: Discharge home once improved   Consultants:   Gastroenterology  Procedures:  EGD:- Normal esophagus.                           - Z-line irregular, 41 cm from the incisors.                           - 2 cm hiatal hernia.                           - Normal stomach.                           - Normal duodenal bulb and second portion of the                            duodenum.                           - No specimens collected.     Antimicrobials:      Subjective: Feeling better today.  He has not had a bowel movement.  Overall abdominal pain and neck pain are better.  Objective: Vitals:   10/15/17 0739 10/15/17 0800 10/15/17 0900 10/15/17 1000  BP:  113/79 112/60 123/82  Pulse:  95 (!) 102 (!) 105  Resp:  18 17 20   Temp: 98.1  F (36.7 C)     TempSrc: Oral     SpO2:  97% 97% 98%  Weight:      Height:        Intake/Output Summary (Last 24 hours) at 10/15/2017 1018 Last data filed at 10/15/2017 0739 Gross per 24 hour  Intake 315 ml  Output 3800 ml  Net -3485 ml   Filed Weights   10/14/17 0039 10/14/17 0500 10/15/17 0400  Weight: 121.8 kg (268 lb 8.3 oz) 121.8 kg (268 lb 8.3 oz) 121 kg (266 lb 12.1 oz)    Examination:  General exam: Appears calm and comfortable  Respiratory system: Clear to auscultation. Respiratory effort normal. Cardiovascular system: S1 & S2 heard, RRR. No JVD, murmurs, rubs, gallops or clicks. No pedal edema. Gastrointestinal system: Abdomen is nondistended, soft and nontender. No organomegaly or masses felt. Normal bowel sounds heard. Central nervous system: Alert and oriented. No focal neurological deficits. Extremities: Symmetric 5 x 5 power. Skin: No rashes, lesions or ulcers Psychiatry: Judgement and insight appear normal. Mood & affect appropriate.     Data Reviewed: I have personally reviewed following labs and imaging studies  CBC: Recent Labs  Lab 10/13/17 2147 10/13/17 2351 10/14/17 1355  10/14/17 2053 10/15/17 0403  WBC 10.9*  --   --  8.3 7.6  NEUTROABS 5.8  --   --   --   --   HGB 10.9* 10.2* 10.3* 10.8* 10.7*  HCT 34.6* 32.1* 32.8* 34.2* 34.3*  MCV 88.0  --   --  88.8 89.6  PLT 265  --   --  237 833   Basic Metabolic Panel: Recent Labs  Lab 10/13/17 2147 10/14/17 0414 10/15/17 0403  NA 131* 136 135  K 3.0* 3.4* 3.8  CL 95* 104 102  CO2 23 26 27   GLUCOSE 256* 142* 108*  BUN 23* 20 10  CREATININE 1.30* 0.92 0.64  CALCIUM 9.3 8.8* 8.4*  MG  --  2.4  --    GFR: Estimated Creatinine Clearance: 132.4 mL/min (by C-G formula based on SCr of 0.64 mg/dL). Liver Function Tests: Recent Labs  Lab 10/13/17 2147  AST 34  ALT 21  ALKPHOS 40  BILITOT 0.6  PROT 7.0  ALBUMIN 3.9   Recent Labs  Lab 10/13/17 2147  LIPASE 42   No results for input(s): AMMONIA in the last 168 hours. Coagulation Profile: Recent Labs  Lab 10/13/17 2147  INR 1.07   Cardiac Enzymes: Recent Labs  Lab 10/13/17 2147  TROPONINI <0.03   BNP (last 3 results) No results for input(s): PROBNP in the last 8760 hours. HbA1C: No results for input(s): HGBA1C in the last 72 hours. CBG: Recent Labs  Lab 10/14/17 1134 10/14/17 1627 10/14/17 2052 10/14/17 2330 10/15/17 0423  GLUCAP 130* 96 112* 116* 107*   Lipid Profile: No results for input(s): CHOL, HDL, LDLCALC, TRIG, CHOLHDL, LDLDIRECT in the last 72 hours. Thyroid Function Tests: No results for input(s): TSH, T4TOTAL, FREET4, T3FREE, THYROIDAB in the last 72 hours. Anemia Panel: No results for input(s): VITAMINB12, FOLATE, FERRITIN, TIBC, IRON, RETICCTPCT in the last 72 hours. Sepsis Labs: No results for input(s): PROCALCITON, LATICACIDVEN in the last 168 hours.  Recent Results (from the past 240 hour(s))  MRSA PCR Screening     Status: None   Collection Time: 10/14/17 12:25 AM  Result Value Ref Range Status   MRSA by PCR NEGATIVE NEGATIVE Final    Comment:        The GeneXpert MRSA Assay (FDA  approved for NASAL  specimens only), is one component of a comprehensive MRSA colonization surveillance program. It is not intended to diagnose MRSA infection nor to guide or monitor treatment for MRSA infections.          Radiology Studies: Dg Chest Port 1 View  Result Date: 10/13/2017 CLINICAL DATA:  Dizziness and chest pain. EXAM: PORTABLE CHEST 1 VIEW COMPARISON:  04/19/2005 chest radiograph FINDINGS: This is a low volume film. The cardiomediastinal silhouette is unremarkable. There is no evidence of focal airspace disease, pulmonary edema, suspicious pulmonary nodule/mass, pleural effusion, or pneumothorax. No acute bony abnormalities are identified. IMPRESSION: No active disease. Electronically Signed   By: Margarette Canada M.D.   On: 10/13/2017 22:19   Ct Angio Chest/abd/pel For Dissection W And/or W/wo  Result Date: 10/14/2017 CLINICAL DATA:  Mallet, chest pain and back pain. Tachycardia and hypotension. EXAM: CT ANGIOGRAPHY CHEST, ABDOMEN AND PELVIS TECHNIQUE: Multidetector CT imaging through the chest, abdomen and pelvis was performed using the standard protocol during bolus administration of intravenous contrast. Multiplanar reconstructed images and MIPs were obtained and reviewed to evaluate the vascular anatomy. CONTRAST:  175mL ISOVUE-370 IOPAMIDOL (ISOVUE-370) INJECTION 76% COMPARISON:  None. FINDINGS: CTA CHEST FINDINGS Cardiovascular: The thoracic aorta is normal in caliber and demonstrates no evidence of aneurysm or dissection. No intramural hemorrhage, penetrating ulcer disease or significant atherosclerosis. Mediastinum/Nodes: No enlarged mediastinal, hilar, or axillary lymph nodes. Thyroid gland, trachea, and esophagus demonstrate no significant findings. Lungs/Pleura: There is no evidence of pulmonary edema, consolidation, pneumothorax, nodule or pleural fluid. Musculoskeletal: No chest wall abnormality. No acute or significant osseous findings. Review of the MIP images confirms the above  findings. CTA ABDOMEN AND PELVIS FINDINGS VASCULAR Aorta: The abdominal aorta is normal in caliber and demonstrates no evidence of aneurysm or dissection. Minimal plaque in the distal aorta. Celiac: Normally patent.  Normal distal branching anatomy. SMA: Normally patent. Renals: Bilateral single renal arteries are widely patent. IMA: Normally patent. Bilateral iliac artery show normal patency bilaterally. Inflow: Normal patency of bilateral common femoral arteries and femoral bifurcations. Review of the MIP images confirms the above findings. NON-VASCULAR Hepatobiliary: The liver shows diffuse fatty infiltration. Early cirrhotic changes are suggested by enlargement of the left lobe and some degree of periportal atrophy with increased fat anterior to the intrahepatic right portal vein. There is a focal area of arterial phase hypervascular enhancement in the anterior subcapsular left lobe over a region roughly 2 cm but also including a more focal intense nodular rounded area of 0.8 cm. While this may represent the normal perfusion abnormality, given the presence of steatosis and probable early cirrhosis, MRI of the abdomen and correlation with AFP level is recommended. Pancreas: Unremarkable appearance without evidence of visible mass or inflammation. No ductal dilatation. Spleen: Normal in size without focal abnormality. Adrenals/Urinary Tract: Adrenal glands are unremarkable. Kidneys are normal, without renal calculi, focal lesion, or hydronephrosis. Benign appearing simple cyst of the mid to lower right kidney measures approximately 8 cm in greatest diameter. Bladder is unremarkable. Stomach/Bowel: Bowel shows no evidence of obstruction or inflammation. No obvious GI bleeding source un covered by CTA. No significant diverticular disease. No free air. Lymphatic: No enlarged lymph nodes identified in the abdomen or pelvis. Reproductive: Prostate is unremarkable. Other: Lower midline abdominal ventral hernia mesh  identified without evidence of hernia or abnormal fluid collection. Musculoskeletal: Degenerative disc disease at L5-S1. Review of the MIP images confirms the above findings. IMPRESSION: 1. No evidence of acute aortic pathology or aneurysmal disease.  2. Hepatic steatosis with suspected early cirrhotic changes. Arterial phase hypervascular enhancement in the anterior subcapsular left lobe identified over a region of 2 cm but also including a more focal intense nodular area of roughly 0.8 cm. Although this may represent the normal perfusion abnormality, further evaluation with MRI of the abdomen with and without contrast and correlation with AFP level is recommended given the presence of liver disease. Electronically Signed   By: Aletta Edouard M.D.   On: 10/14/2017 12:55        Scheduled Meds: . insulin aspart  0-9 Units Subcutaneous Q4H  . pantoprazole (PROTONIX) IV  40 mg Intravenous Once  . pantoprazole  40 mg Intravenous Daily  . sodium chloride flush  3 mL Intravenous Q12H   Continuous Infusions:    LOS: 2 days    Time spent: 25 mins    Kathie Dike, MD [facility] Pager [phone]  If 7PM-7AM, please contact night-coverage [email or web address] Password [email or web address] 10/15/2017, 10:18 AM

## 2017-10-15 NOTE — Progress Notes (Signed)
Subjective: No overt GI bleeding. States he has not had BM since admitted. No abdominal pain, N/V. Agile capsule was placed this morning.   Objective: Vital signs in last 24 hours: Temp:  [97.6 F (36.4 C)-98.7 F (37.1 C)] 98.1 F (36.7 C) (12/03 0739) Pulse Rate:  [95-127] 95 (12/03 0800) Resp:  [12-29] 18 (12/03 0800) BP: (95-153)/(43-125) 113/79 (12/03 0800) SpO2:  [87 %-100 %] 97 % (12/03 0800) Weight:  [266 lb 12.1 oz (121 kg)] 266 lb 12.1 oz (121 kg) (12/03 0400) Last BM Date: 10/13/17 General:   Alert and oriented, pleasant Head:  Normocephalic and atraumatic. Eyes:  No icterus, sclera clear. Conjuctiva pink.  Abdomen:  Bowel sounds present, distended but soft, no rebound or guarding, no TTP Neurologic:  Alert and  oriented x4 Psych:  Alert and cooperative. Normal mood and affect.  Intake/Output from previous day: 12/02 0701 - 12/03 0700 In: 759 [I.V.:250; Blood:509] Out: 3600 [Urine:3600] Intake/Output this shift: Total I/O In: -  Out: 600 [Urine:600]  Lab Results: Recent Labs    10/13/17 2147  10/14/17 1355 10/14/17 2053 10/15/17 0403  WBC 10.9*  --   --  8.3 7.6  HGB 10.9*   < > 10.3* 10.8* 10.7*  HCT 34.6*   < > 32.8* 34.2* 34.3*  PLT 265  --   --  237 265   < > = values in this interval not displayed.   BMET Recent Labs    10/13/17 2147 10/14/17 0414 10/15/17 0403  NA 131* 136 135  K 3.0* 3.4* 3.8  CL 95* 104 102  CO2 23 26 27   GLUCOSE 256* 142* 108*  BUN 23* 20 10  CREATININE 1.30* 0.92 0.64  CALCIUM 9.3 8.8* 8.4*   LFT Recent Labs    10/13/17 2147  PROT 7.0  ALBUMIN 3.9  AST 34  ALT 21  ALKPHOS 40  BILITOT 0.6   PT/INR Recent Labs    10/13/17 2147  LABPROT 13.8  INR 1.07     Studies/Results: Dg Chest Port 1 View  Result Date: 10/13/2017 CLINICAL DATA:  Dizziness and chest pain. EXAM: PORTABLE CHEST 1 VIEW COMPARISON:  04/19/2005 chest radiograph FINDINGS: This is a low volume film. The cardiomediastinal silhouette  is unremarkable. There is no evidence of focal airspace disease, pulmonary edema, suspicious pulmonary nodule/mass, pleural effusion, or pneumothorax. No acute bony abnormalities are identified. IMPRESSION: No active disease. Electronically Signed   By: Margarette Canada M.D.   On: 10/13/2017 22:19   Ct Angio Chest/abd/pel For Dissection W And/or W/wo  Result Date: 10/14/2017 CLINICAL DATA:  Mallet, chest pain and back pain. Tachycardia and hypotension. EXAM: CT ANGIOGRAPHY CHEST, ABDOMEN AND PELVIS TECHNIQUE: Multidetector CT imaging through the chest, abdomen and pelvis was performed using the standard protocol during bolus administration of intravenous contrast. Multiplanar reconstructed images and MIPs were obtained and reviewed to evaluate the vascular anatomy. CONTRAST:  160mL ISOVUE-370 IOPAMIDOL (ISOVUE-370) INJECTION 76% COMPARISON:  None. FINDINGS: CTA CHEST FINDINGS Cardiovascular: The thoracic aorta is normal in caliber and demonstrates no evidence of aneurysm or dissection. No intramural hemorrhage, penetrating ulcer disease or significant atherosclerosis. Mediastinum/Nodes: No enlarged mediastinal, hilar, or axillary lymph nodes. Thyroid gland, trachea, and esophagus demonstrate no significant findings. Lungs/Pleura: There is no evidence of pulmonary edema, consolidation, pneumothorax, nodule or pleural fluid. Musculoskeletal: No chest wall abnormality. No acute or significant osseous findings. Review of the MIP images confirms the above findings. CTA ABDOMEN AND PELVIS FINDINGS VASCULAR Aorta: The abdominal aorta is  normal in caliber and demonstrates no evidence of aneurysm or dissection. Minimal plaque in the distal aorta. Celiac: Normally patent.  Normal distal branching anatomy. SMA: Normally patent. Renals: Bilateral single renal arteries are widely patent. IMA: Normally patent. Bilateral iliac artery show normal patency bilaterally. Inflow: Normal patency of bilateral common femoral arteries and  femoral bifurcations. Review of the MIP images confirms the above findings. NON-VASCULAR Hepatobiliary: The liver shows diffuse fatty infiltration. Early cirrhotic changes are suggested by enlargement of the left lobe and some degree of periportal atrophy with increased fat anterior to the intrahepatic right portal vein. There is a focal area of arterial phase hypervascular enhancement in the anterior subcapsular left lobe over a region roughly 2 cm but also including a more focal intense nodular rounded area of 0.8 cm. While this may represent the normal perfusion abnormality, given the presence of steatosis and probable early cirrhosis, MRI of the abdomen and correlation with AFP level is recommended. Pancreas: Unremarkable appearance without evidence of visible mass or inflammation. No ductal dilatation. Spleen: Normal in size without focal abnormality. Adrenals/Urinary Tract: Adrenal glands are unremarkable. Kidneys are normal, without renal calculi, focal lesion, or hydronephrosis. Benign appearing simple cyst of the mid to lower right kidney measures approximately 8 cm in greatest diameter. Bladder is unremarkable. Stomach/Bowel: Bowel shows no evidence of obstruction or inflammation. No obvious GI bleeding source un covered by CTA. No significant diverticular disease. No free air. Lymphatic: No enlarged lymph nodes identified in the abdomen or pelvis. Reproductive: Prostate is unremarkable. Other: Lower midline abdominal ventral hernia mesh identified without evidence of hernia or abnormal fluid collection. Musculoskeletal: Degenerative disc disease at L5-S1. Review of the MIP images confirms the above findings. IMPRESSION: 1. No evidence of acute aortic pathology or aneurysmal disease. 2. Hepatic steatosis with suspected early cirrhotic changes. Arterial phase hypervascular enhancement in the anterior subcapsular left lobe identified over a region of 2 cm but also including a more focal intense nodular area  of roughly 0.8 cm. Although this may represent the normal perfusion abnormality, further evaluation with MRI of the abdomen with and without contrast and correlation with AFP level is recommended given the presence of liver disease. Electronically Signed   By: Aletta Edouard M.D.   On: 10/14/2017 12:55    Assessment: 56 year old male presenting with melena initially on admission, with evidence of burgundy stool as well, with EGD 12/2 noting small sliding hiatal hernia otherwise normal. Colonoscopy on file from last year by Dr. Laural Golden. Capsule study will be needed but due to history of SBOs, agile capsule was given this morning. Needs KUB at 11am tomorrow to assess for passage of capsule.   Acute blood loss anemia: with hypotension. Transfused 2 units PRBCs. Hgb stable. Hypotension resolved. No further bleeding since admission.   Chest and abdominal CT negative for aneurysm or dissection but small area of enhancement noted in left lobe of liver. Will need outpatient MRI evaluation when clinically improved.   Plan: KUB tomorrow at 11am (discussed with endoscopy to verify appropriate timing) Follow H/H PPI daily  Clears for now in preparation for possible capsule tomorrow If agile capsule shows patency, will need Givens capsule placed Outpatient MRI liver due to due to concern for liver lesion in setting of hepatic steatosis and possible early cirrhotic changes.  Further endoscopic procedures would need to be done with Propofol    Annitta Needs, PhD, ANP-BC [facility]    LOS: 2 days    10/15/2017, 8:29 AM

## 2017-10-15 NOTE — Progress Notes (Signed)
Received patient from ICU to room 332, patient A&O x4, no acute distress noted, denies any pain. Patient denies any pain. Family at bedside

## 2017-10-16 ENCOUNTER — Inpatient Hospital Stay (HOSPITAL_COMMUNITY): Payer: 59

## 2017-10-16 ENCOUNTER — Telehealth: Payer: Self-pay | Admitting: Gastroenterology

## 2017-10-16 ENCOUNTER — Encounter (HOSPITAL_COMMUNITY): Payer: Self-pay | Admitting: Internal Medicine

## 2017-10-16 LAB — GLUCOSE, CAPILLARY
GLUCOSE-CAPILLARY: 120 mg/dL — AB (ref 65–99)
GLUCOSE-CAPILLARY: 121 mg/dL — AB (ref 65–99)
GLUCOSE-CAPILLARY: 112 mg/dL — AB (ref 65–99)
Glucose-Capillary: 116 mg/dL — ABNORMAL HIGH (ref 65–99)
Glucose-Capillary: 119 mg/dL — ABNORMAL HIGH (ref 65–99)

## 2017-10-16 LAB — CBC
HEMATOCRIT: 33.8 % — AB (ref 39.0–52.0)
HEMOGLOBIN: 10.4 g/dL — AB (ref 13.0–17.0)
MCH: 27.4 pg (ref 26.0–34.0)
MCHC: 30.8 g/dL (ref 30.0–36.0)
MCV: 88.9 fL (ref 78.0–100.0)
Platelets: 274 10*3/uL (ref 150–400)
RBC: 3.8 MIL/uL — AB (ref 4.22–5.81)
RDW: 14.8 % (ref 11.5–15.5)
WBC: 7 10*3/uL (ref 4.0–10.5)

## 2017-10-16 MED ORDER — PANTOPRAZOLE SODIUM 40 MG PO TBEC: 40.0000 mg | DELAYED_RELEASE_TABLET | Freq: Every day | ORAL | Status: DC

## 2017-10-16 NOTE — Telephone Encounter (Signed)
Patient is going to be discharged today from hospital. We need to arrange outpatient agile capsule study for this upcoming Monday.

## 2017-10-16 NOTE — Progress Notes (Signed)
Victor Contreras discharged Home per MD order.  Discharge instructions reviewed and discussed with the patient, all questions and concerns answered. Copy of instructions given to patient.  Allergies as of 10/16/2017   No Known Allergies     Medication List    TAKE these medications   amLODipine 5 MG tablet Commonly known as:  NORVASC Take 5 mg by mouth daily.   canagliflozin 300 MG Tabs tablet Commonly known as:  INVOKANA Take 300 mg by mouth daily before breakfast.   cetirizine 10 MG tablet Commonly known as:  ZYRTEC Take 10 mg by mouth daily.   fish oil-omega-3 fatty acids 1000 MG capsule Take 1 g by mouth daily.   insulin detemir 100 UNIT/ML injection Commonly known as:  LEVEMIR Inject 10 Units into the skin daily.   pantoprazole 40 MG tablet Commonly known as:  PROTONIX Take 40 mg by mouth daily.   rosuvastatin 5 MG tablet Commonly known as:  CRESTOR Take 5 mg by mouth daily.   sitaGLIPtin 100 MG tablet Commonly known as:  JANUVIA Take 100 mg by mouth daily.   tamsulosin 0.4 MG Caps capsule Commonly known as:  FLOMAX Take 0.4 mg by mouth.   valsartan-hydrochlorothiazide 160-12.5 MG tablet Commonly known as:  DIOVAN-HCT Take 1 tablet by mouth daily.       Patients skin is clean, dry and intact, no evidence of skin break down. IV site discontinued and catheter remains intact. Site without signs and symptoms of complications. Dressing and pressure applied.  Patient escorted to car in a wheelchair,  no distress noted upon discharge.  Victor Contreras 10/16/2017 7:57 PM

## 2017-10-16 NOTE — Progress Notes (Signed)
REVIEWED-NO ADDITIONAL RECOMMENDATIONS.  KUB at 11 am today with agile capsule located in LLQ, likely in distal descending colon although unable to rule out located in the small bowel. I reviewed this with radiologist at [facility], Dr. Register. Again, unable to determine if actually in colon or not. Will repeat xray this evening at 8pm. Agile capsule could have dissolved by this point but if it has not dissolved, hopefully there will be a better view of where this capsule is located. Will have him NPO after midnight in case actual givens capsule study can be performed tomorrow.    Addendum at 1733: KUB already done. It is uncertain if capsule actually passed or actually dissolved. As patient is stable, he can be discharged home with a repeat agile capsule study on Monday to be planned. Will notify Dr. Roderic Palau.

## 2017-10-16 NOTE — Progress Notes (Signed)
Subjective: No abdominal pain. Small black stool yesterday. No N/V. KUB today at 11 planned. Had clear liquids for breakfast.   Objective: Vital signs in last 24 hours: Temp:  [97.6 F (36.4 C)-99.5 F (37.5 C)] 99.2 F (37.3 C) (12/04 0416) Pulse Rate:  [95-106] 103 (12/04 0416) Resp:  [17-21] 21 (12/04 0416) BP: (112-136)/(50-82) 121/77 (12/04 0416) SpO2:  [94 %-99 %] 97 % (12/04 0416) Weight:  [261 lb (118.4 kg)] 261 lb (118.4 kg) (12/04 0416) Last BM Date: 10/15/17 General:   Alert and oriented, pleasant Head:  Normocephalic and atraumatic. Abdomen:  Bowel sounds present, soft, obese, non-tender Msk:  Symmetrical without gross deformities. Normal posture. Neurologic:  Alert and  oriented x4 Psych:  Alert and cooperative. Normal mood and affect.  Intake/Output from previous day: 12/03 0701 - 12/04 0700 In: -  Out: 1000 [Urine:1000] Intake/Output this shift: No intake/output data recorded.  Lab Results: Recent Labs    10/14/17 2053 10/15/17 0403 10/16/17 0336  WBC 8.3 7.6 7.0  HGB 10.8* 10.7* 10.4*  HCT 34.2* 34.3* 33.8*  PLT 237 265 274   BMET Recent Labs    10/13/17 2147 10/14/17 0414 10/15/17 0403  NA 131* 136 135  K 3.0* 3.4* 3.8  CL 95* 104 102  CO2 23 26 27   GLUCOSE 256* 142* 108*  BUN 23* 20 10  CREATININE 1.30* 0.92 0.64  CALCIUM 9.3 8.8* 8.4*   LFT Recent Labs    10/13/17 2147  PROT 7.0  ALBUMIN 3.9  AST 34  ALT 21  ALKPHOS 40  BILITOT 0.6   PT/INR Recent Labs    10/13/17 2147  LABPROT 13.8  INR 1.07    Studies/Results: Ct Angio Chest/abd/pel For Dissection W And/or W/wo  Result Date: 10/14/2017 CLINICAL DATA:  Mallet, chest pain and back pain. Tachycardia and hypotension. EXAM: CT ANGIOGRAPHY CHEST, ABDOMEN AND PELVIS TECHNIQUE: Multidetector CT imaging through the chest, abdomen and pelvis was performed using the standard protocol during bolus administration of intravenous contrast. Multiplanar reconstructed images and  MIPs were obtained and reviewed to evaluate the vascular anatomy. CONTRAST:  171mL ISOVUE-370 IOPAMIDOL (ISOVUE-370) INJECTION 76% COMPARISON:  None. FINDINGS: CTA CHEST FINDINGS Cardiovascular: The thoracic aorta is normal in caliber and demonstrates no evidence of aneurysm or dissection. No intramural hemorrhage, penetrating ulcer disease or significant atherosclerosis. Mediastinum/Nodes: No enlarged mediastinal, hilar, or axillary lymph nodes. Thyroid gland, trachea, and esophagus demonstrate no significant findings. Lungs/Pleura: There is no evidence of pulmonary edema, consolidation, pneumothorax, nodule or pleural fluid. Musculoskeletal: No chest wall abnormality. No acute or significant osseous findings. Review of the MIP images confirms the above findings. CTA ABDOMEN AND PELVIS FINDINGS VASCULAR Aorta: The abdominal aorta is normal in caliber and demonstrates no evidence of aneurysm or dissection. Minimal plaque in the distal aorta. Celiac: Normally patent.  Normal distal branching anatomy. SMA: Normally patent. Renals: Bilateral single renal arteries are widely patent. IMA: Normally patent. Bilateral iliac artery show normal patency bilaterally. Inflow: Normal patency of bilateral common femoral arteries and femoral bifurcations. Review of the MIP images confirms the above findings. NON-VASCULAR Hepatobiliary: The liver shows diffuse fatty infiltration. Early cirrhotic changes are suggested by enlargement of the left lobe and some degree of periportal atrophy with increased fat anterior to the intrahepatic right portal vein. There is a focal area of arterial phase hypervascular enhancement in the anterior subcapsular left lobe over a region roughly 2 cm but also including a more focal intense nodular rounded area of 0.8 cm. While  this may represent the normal perfusion abnormality, given the presence of steatosis and probable early cirrhosis, MRI of the abdomen and correlation with AFP level is  recommended. Pancreas: Unremarkable appearance without evidence of visible mass or inflammation. No ductal dilatation. Spleen: Normal in size without focal abnormality. Adrenals/Urinary Tract: Adrenal glands are unremarkable. Kidneys are normal, without renal calculi, focal lesion, or hydronephrosis. Benign appearing simple cyst of the mid to lower right kidney measures approximately 8 cm in greatest diameter. Bladder is unremarkable. Stomach/Bowel: Bowel shows no evidence of obstruction or inflammation. No obvious GI bleeding source un covered by CTA. No significant diverticular disease. No free air. Lymphatic: No enlarged lymph nodes identified in the abdomen or pelvis. Reproductive: Prostate is unremarkable. Other: Lower midline abdominal ventral hernia mesh identified without evidence of hernia or abnormal fluid collection. Musculoskeletal: Degenerative disc disease at L5-S1. Review of the MIP images confirms the above findings. IMPRESSION: 1. No evidence of acute aortic pathology or aneurysmal disease. 2. Hepatic steatosis with suspected early cirrhotic changes. Arterial phase hypervascular enhancement in the anterior subcapsular left lobe identified over a region of 2 cm but also including a more focal intense nodular area of roughly 0.8 cm. Although this may represent the normal perfusion abnormality, further evaluation with MRI of the abdomen with and without contrast and correlation with AFP level is recommended given the presence of liver disease. Electronically Signed   By: Aletta Edouard M.D.   On: 10/14/2017 12:55    Assessment: 56 year old male presenting with melena initially on admission, with evidence of burgundy stool as well, with EGD 12/2 noting small sliding hiatal hernia otherwise normal. Colonoscopy on file from last year by Dr. Laural Golden. Capsule study will be needed but due to history of SBOs, agile capsule was undergone yesterday. KUB this morning at 11 am. Will make NPO in case capsule  can then be given today.   Acute blood loss anemia: with hypotension. Transfused 2 units PRBCs. Hgb overall stable. Hypotension resolved. Last evidence of melena yesterday.  Chest and abdominal CT negative for aneurysm or dissection but small area of enhancement noted in left lobe of liver. Will need outpatient MRI evaluation when clinically improved.    Plan: NPO KUB at 11 am: if appropriate, will need capsule study Follow H/H PPI daily Outpatient MRI liver due to concern for liver lesion Further endoscopic procedures would need to be done with Propofol   Annitta Needs, PhD, ANP-BC [facility]    LOS: 3 days    10/16/2017, 7:53 AM

## 2017-10-16 NOTE — Discharge Summary (Signed)
Physician Discharge Summary  Victor Contreras NWG:[id number] DOB: Dec 03, 1960 DOA: 10/13/2017  PCP: Monico Blitz, MD  Admit date: 10/13/2017 Discharge date: 10/16/2017  Admitted From: home Disposition:  home  Recommendations for Outpatient Follow-up:  1. Follow up with PCP in 1-2 weeks 2. Please obtain BMP/CBC in one week 3. Follow-up with gastroenterology on Monday for outpatient agile capsule study.   Discharge Condition: Stable CODE STATUS: Full code Diet recommendation: Heart Healthy / Carb Modified   Brief/Interim Summary: 56 year old male presents to the hospital with abdominal pain, rectal bleeding and lightheadedness.  He was taking regular NSAIDs and aspirin.  Noted to be hypotensive in the emergency room.  Started on Protonix infusion, IV fluids and received 2 units of PRBCs.  He was admitted for further management of acute GI bleeding.  Discharge Diagnoses:  Principal Problem:   Gastrointestinal hemorrhage Active Problems:   Essential hypertension   Diabetes (HCC)   Hyponatremia   Mild renal insufficiency   Acute posthemorrhagic anemia  1. Acute GI bleeding.  Patient presented with 2 days of rectal bleeding and intermittent abdominal pain.  He admitted to taking regular Aleve, aspirin.  Started on Protonix.  Gastroenterology following.  CT imaging did not indicate any aortic dissection or aneurysm.  EGD was unrevealing.  GI was following and patient underwent agile capsule today, but it was not clear if the capsule adequately passed through GI tract. Clinically, patient did not appear to have further GI bleeding and hemoglobin was stable. Arrangements were made by GI team to pursue further work up as outpatient. 2. Hypotension.  Patient arrived with a blood pressure of 79/53.  He received IV fluids.  Antihypertensives were held.  Overall blood pressure has improved with volume resuscitation.  Resume antihypertensives after discharge. 3. Acute blood loss anemia.  Baseline  hemoglobin is unclear since the prior labs from 2012 show hemoglobin of 15.3.  He presented with a hemoglobin of 10.9.  Since he was acutely bleeding and was hypotensive, he was transfused 2 units of PRBCs.  Follow-up hemoglobin has remained stable. 4. Diabetes.    Oral agents were held on admission.    He was treated with sliding scale insulin.  Blood sugars remained stable.  Resume outpatient regimen on discharge 5. Acute kidney injury.  Likely related to volume depletion and hypotension.  Improved with IV fluids. 6. Hyponatremia.  Likely related to hypovolemia.  Improved after receiving IV hydration.   Discharge Instructions  Discharge Instructions    Diet - low sodium heart healthy   Complete by:  As directed    Increase activity slowly   Complete by:  As directed      Allergies as of 10/16/2017   No Known Allergies     Medication List    TAKE these medications   amLODipine 5 MG tablet Commonly known as:  NORVASC Take 5 mg by mouth daily.   canagliflozin 300 MG Tabs tablet Commonly known as:  INVOKANA Take 300 mg by mouth daily before breakfast.   cetirizine 10 MG tablet Commonly known as:  ZYRTEC Take 10 mg by mouth daily.   fish oil-omega-3 fatty acids 1000 MG capsule Take 1 g by mouth daily.   insulin detemir 100 UNIT/ML injection Commonly known as:  LEVEMIR Inject 10 Units into the skin daily.   pantoprazole 40 MG tablet Commonly known as:  PROTONIX Take 40 mg by mouth daily.   rosuvastatin 5 MG tablet Commonly known as:  CRESTOR Take 5 mg by mouth daily.  sitaGLIPtin 100 MG tablet Commonly known as:  JANUVIA Take 100 mg by mouth daily.   tamsulosin 0.4 MG Caps capsule Commonly known as:  FLOMAX Take 0.4 mg by mouth.   valsartan-hydrochlorothiazide 160-12.5 MG tablet Commonly known as:  DIOVAN-HCT Take 1 tablet by mouth daily.      Follow-up Information    Fields, Marga Melnick, MD Follow up.   Specialty:  Gastroenterology Why:  follow up on monday  for repeat capsule study Contact information: [id number] [place] [address] [phone]          No Known Allergies  Consultations:  Gastroenterology   Procedures/Studies: Dg Abd 1 View  Result Date: 10/16/2017 CLINICAL DATA:  GI bleed.  Nausea. EXAM: ABDOMEN - 1 VIEW COMPARISON:  10/16/2017. FINDINGS: Surgical clips right abdomen. Previously identified capsule was passed. Air-filled loops of small large bowel again noted. No acute bony abnormality .No bowel distention or free air. IMPRESSION: Interim passage of capsule. Electronically Signed   By: Marcello Moores  Register   On: 10/16/2017 17:01   Dg Abd 1 View  Result Date: 10/16/2017 CLINICAL DATA:  Agile capsule EXAM: ABDOMEN - 1 VIEW COMPARISON:  None. FINDINGS: Agile capsule was noted in the left lower quadrant, likely within the distal descending colon although a small bowel location could have a similar position. Recommend continued follow-up. IMPRESSION: Agile capsule in the left lower quadrant, likely in the distal descending colon although a small bowel position could have a similar appearance. Recommend continued follow-up. Electronically Signed   By: Rolm Baptise M.D.   On: 10/16/2017 11:04   Dg Chest Port 1 View  Result Date: 10/13/2017 CLINICAL DATA:  Dizziness and chest pain. EXAM: PORTABLE CHEST 1 VIEW COMPARISON:  04/19/2005 chest radiograph FINDINGS: This is a low volume film. The cardiomediastinal silhouette is unremarkable. There is no evidence of focal airspace disease, pulmonary edema, suspicious pulmonary nodule/mass, pleural effusion, or pneumothorax. No acute bony abnormalities are identified. IMPRESSION: No active disease. Electronically Signed   By: Margarette Canada M.D.   On: 10/13/2017 22:19   Ct Angio Chest/abd/pel For Dissection W And/or W/wo  Result Date: 10/14/2017 CLINICAL DATA:  Mallet, chest pain and back pain. Tachycardia and hypotension. EXAM: CT ANGIOGRAPHY CHEST, ABDOMEN AND PELVIS TECHNIQUE:  Multidetector CT imaging through the chest, abdomen and pelvis was performed using the standard protocol during bolus administration of intravenous contrast. Multiplanar reconstructed images and MIPs were obtained and reviewed to evaluate the vascular anatomy. CONTRAST:  133mL ISOVUE-370 IOPAMIDOL (ISOVUE-370) INJECTION 76% COMPARISON:  None. FINDINGS: CTA CHEST FINDINGS Cardiovascular: The thoracic aorta is normal in caliber and demonstrates no evidence of aneurysm or dissection. No intramural hemorrhage, penetrating ulcer disease or significant atherosclerosis. Mediastinum/Nodes: No enlarged mediastinal, hilar, or axillary lymph nodes. Thyroid gland, trachea, and esophagus demonstrate no significant findings. Lungs/Pleura: There is no evidence of pulmonary edema, consolidation, pneumothorax, nodule or pleural fluid. Musculoskeletal: No chest wall abnormality. No acute or significant osseous findings. Review of the MIP images confirms the above findings. CTA ABDOMEN AND PELVIS FINDINGS VASCULAR Aorta: The abdominal aorta is normal in caliber and demonstrates no evidence of aneurysm or dissection. Minimal plaque in the distal aorta. Celiac: Normally patent.  Normal distal branching anatomy. SMA: Normally patent. Renals: Bilateral single renal arteries are widely patent. IMA: Normally patent. Bilateral iliac artery show normal patency bilaterally. Inflow: Normal patency of bilateral common femoral arteries and femoral bifurcations. Review of the MIP images confirms the above findings. NON-VASCULAR Hepatobiliary: The liver shows diffuse fatty infiltration. Early  cirrhotic changes are suggested by enlargement of the left lobe and some degree of periportal atrophy with increased fat anterior to the intrahepatic right portal vein. There is a focal area of arterial phase hypervascular enhancement in the anterior subcapsular left lobe over a region roughly 2 cm but also including a more focal intense nodular rounded area  of 0.8 cm. While this may represent the normal perfusion abnormality, given the presence of steatosis and probable early cirrhosis, MRI of the abdomen and correlation with AFP level is recommended. Pancreas: Unremarkable appearance without evidence of visible mass or inflammation. No ductal dilatation. Spleen: Normal in size without focal abnormality. Adrenals/Urinary Tract: Adrenal glands are unremarkable. Kidneys are normal, without renal calculi, focal lesion, or hydronephrosis. Benign appearing simple cyst of the mid to lower right kidney measures approximately 8 cm in greatest diameter. Bladder is unremarkable. Stomach/Bowel: Bowel shows no evidence of obstruction or inflammation. No obvious GI bleeding source un covered by CTA. No significant diverticular disease. No free air. Lymphatic: No enlarged lymph nodes identified in the abdomen or pelvis. Reproductive: Prostate is unremarkable. Other: Lower midline abdominal ventral hernia mesh identified without evidence of hernia or abnormal fluid collection. Musculoskeletal: Degenerative disc disease at L5-S1. Review of the MIP images confirms the above findings. IMPRESSION: 1. No evidence of acute aortic pathology or aneurysmal disease. 2. Hepatic steatosis with suspected early cirrhotic changes. Arterial phase hypervascular enhancement in the anterior subcapsular left lobe identified over a region of 2 cm but also including a more focal intense nodular area of roughly 0.8 cm. Although this may represent the normal perfusion abnormality, further evaluation with MRI of the abdomen with and without contrast and correlation with AFP level is recommended given the presence of liver disease. Electronically Signed   By: Aletta Edouard M.D.   On: 10/14/2017 12:55    (Echo, Carotid, EGD, Colonoscopy, ERCP)    Subjective:   Discharge Exam: Vitals:   10/16/17 0416 10/16/17 1500  BP: 121/77 119/89  Pulse: (!) 103 (!) 106  Resp: (!) 21 20  Temp: 99.2 F (37.3  C) 98.5 F (36.9 C)  SpO2: 97% 96%   Vitals:   10/15/17 2024 10/15/17 2207 10/16/17 0416 10/16/17 1500  BP:  130/77 121/77 119/89  Pulse:  (!) 106 (!) 103 (!) 106  Resp:  20 (!) 21 20  Temp:  99.5 F (37.5 C) 99.2 F (37.3 C) 98.5 F (36.9 C)  TempSrc:  Oral Oral Oral  SpO2: 94% 98% 97% 96%  Weight:   118.4 kg (261 lb)   Height:        General: Pt is alert, awake, not in acute distress Cardiovascular: RRR, S1/S2 +, no rubs, no gallops Respiratory: CTA bilaterally, no wheezing, no rhonchi Abdominal: Soft, NT, ND, bowel sounds + Extremities: no edema, no cyanosis    The results of significant diagnostics from this hospitalization (including imaging, microbiology, ancillary and laboratory) are listed below for reference.     Microbiology: Recent Results (from the past 240 hour(s))  MRSA PCR Screening     Status: None   Collection Time: 10/14/17 12:25 AM  Result Value Ref Range Status   MRSA by PCR NEGATIVE NEGATIVE Final    Comment:        The GeneXpert MRSA Assay (FDA approved for NASAL specimens only), is one component of a comprehensive MRSA colonization surveillance program. It is not intended to diagnose MRSA infection nor to guide or monitor treatment for MRSA infections.  Labs: BNP (last 3 results) No results for input(s): BNP in the last 8760 hours. Basic Metabolic Panel: Recent Labs  Lab 10/13/17 2147 10/14/17 0414 10/15/17 0403  NA 131* 136 135  K 3.0* 3.4* 3.8  CL 95* 104 102  CO2 23 26 27   GLUCOSE 256* 142* 108*  BUN 23* 20 10  CREATININE 1.30* 0.92 0.64  CALCIUM 9.3 8.8* 8.4*  MG  --  2.4  --    Liver Function Tests: Recent Labs  Lab 10/13/17 2147  AST 34  ALT 21  ALKPHOS 40  BILITOT 0.6  PROT 7.0  ALBUMIN 3.9   Recent Labs  Lab 10/13/17 2147  LIPASE 42   No results for input(s): AMMONIA in the last 168 hours. CBC: Recent Labs  Lab 10/13/17 2147 10/13/17 2351 10/14/17 1355 10/14/17 2053 10/15/17 0403  10/16/17 0336  WBC 10.9*  --   --  8.3 7.6 7.0  NEUTROABS 5.8  --   --   --   --   --   HGB 10.9* 10.2* 10.3* 10.8* 10.7* 10.4*  HCT 34.6* 32.1* 32.8* 34.2* 34.3* 33.8*  MCV 88.0  --   --  88.8 89.6 88.9  PLT 265  --   --  237 265 274   Cardiac Enzymes: Recent Labs  Lab 10/13/17 2147  TROPONINI <0.03   BNP: Invalid input(s): POCBNP CBG: Recent Labs  Lab 10/16/17 0012 10/16/17 0414 10/16/17 0747 10/16/17 1104 10/16/17 1642  GLUCAP 119* 120* 116* 121* 112*   D-Dimer No results for input(s): DDIMER in the last 72 hours. Hgb A1c No results for input(s): HGBA1C in the last 72 hours. Lipid Profile No results for input(s): CHOL, HDL, LDLCALC, TRIG, CHOLHDL, LDLDIRECT in the last 72 hours. Thyroid function studies No results for input(s): TSH, T4TOTAL, T3FREE, THYROIDAB in the last 72 hours.  Invalid input(s): FREET3 Anemia work up No results for input(s): VITAMINB12, FOLATE, FERRITIN, TIBC, IRON, RETICCTPCT in the last 72 hours. Urinalysis No results found for: COLORURINE, APPEARANCEUR, [place], [place], [place], [place], [place], [place], PROTEINUR, UROBILINOGEN, NITRITE, LEUKOCYTESUR Sepsis Labs Invalid input(s): PROCALCITONIN,  WBC,  LACTICIDVEN Microbiology Recent Results (from the past 240 hour(s))  MRSA PCR Screening     Status: None   Collection Time: 10/14/17 12:25 AM  Result Value Ref Range Status   MRSA by PCR NEGATIVE NEGATIVE Final    Comment:        The GeneXpert MRSA Assay (FDA approved for NASAL specimens only), is one component of a comprehensive MRSA colonization surveillance program. It is not intended to diagnose MRSA infection nor to guide or monitor treatment for MRSA infections.      Time coordinating discharge: Over 30 minutes  SIGNED:   Kathie Dike, MD  [facility] 10/16/2017, 7:01 PM Pager   If 7PM-7AM, please contact night-coverage [email or web address] Password [email or web address]

## 2017-10-16 NOTE — Progress Notes (Signed)
Pt placed on [facility] CPAP. CPAP plugged into red outlet. Pt tolerating well 

## 2017-10-17 ENCOUNTER — Encounter: Payer: Self-pay | Admitting: *Deleted

## 2017-10-17 ENCOUNTER — Other Ambulatory Visit: Payer: Self-pay | Admitting: *Deleted

## 2017-10-17 ENCOUNTER — Other Ambulatory Visit: Payer: Self-pay

## 2017-10-17 DIAGNOSIS — K922 Gastrointestinal hemorrhage, unspecified: Secondary | ICD-10-CM

## 2017-10-17 LAB — BPAM RBC
BLOOD PRODUCT EXPIRATION DATE: 201812212359
BLOOD PRODUCT EXPIRATION DATE: 201812242359
Blood Product Expiration Date: 201812212359
ISSUE DATE / TIME: 201811291132
ISSUE DATE / TIME: 201812020534
ISSUE DATE / TIME: 201812020752
UNIT TYPE AND RH: 5100
Unit Type and Rh: 5100
Unit Type and Rh: 5100

## 2017-10-17 LAB — TYPE AND SCREEN
ABO/RH(D): O POS
ANTIBODY SCREEN: POSITIVE
UNIT DIVISION: 0
Unit division: 0
Unit division: 0

## 2017-10-17 NOTE — Telephone Encounter (Signed)
Spoke with Victor Contreras and pt is able to do agile capsule study on 10/22/17. Orders placed. Called spoke with pt and informed of appt details. Discussed agile capsule study instructions with pt in detail. He verbalized understanding. Patient also aware he is to arrive on 10/22/17 at 7:00am at [place]. Letter also mailed to patient.

## 2017-10-18 ENCOUNTER — Ambulatory Visit (HOSPITAL_COMMUNITY)
Admission: RE | Admit: 2017-10-18 | Discharge: 2017-10-18 | Disposition: A | Discharge status: Home / Self Care | Payer: 59 | Source: Ambulatory Visit | Attending: Gastroenterology | Admitting: Gastroenterology

## 2017-10-18 ENCOUNTER — Encounter (HOSPITAL_COMMUNITY)
Admission: RE | Disposition: A | Discharge status: Home / Self Care | Payer: Self-pay | Source: Ambulatory Visit | Attending: Gastroenterology

## 2017-10-18 ENCOUNTER — Encounter (INDEPENDENT_AMBULATORY_CARE_PROVIDER_SITE_OTHER): Payer: Self-pay | Admitting: Internal Medicine

## 2017-10-18 ENCOUNTER — Telehealth: Payer: Self-pay

## 2017-10-18 DIAGNOSIS — K922 Gastrointestinal hemorrhage, unspecified: Secondary | ICD-10-CM | POA: Insufficient documentation

## 2017-10-18 HISTORY — PX: AGILE CAPSULE: SHX5420

## 2017-10-18 SURGERY — AGILE CAPSULE

## 2017-10-18 NOTE — Telephone Encounter (Signed)
Pt is aware to check with Dr. Olevia Perches office.

## 2017-10-18 NOTE — Telephone Encounter (Signed)
Pt called to ask for a  work note from 10/15/2017 through 10/29/2017. He said that Dr. Oneida Alar was looking after him in Dr. Olevia Perches absence. He said if he could get a note til then, then he would have FMLA paperwork to fill out. I told him I would just let Dr. Oneida Alar know that he had called and ask her recommendation.

## 2017-10-18 NOTE — Telephone Encounter (Signed)
PLEASE CALL PT. HE WILL NEED TO HAVE DR. Laural Golden FILL OUT HIS PAPERWORK WHEN HE RETURNS.

## 2017-10-19 ENCOUNTER — Encounter (HOSPITAL_COMMUNITY): Payer: Self-pay | Admitting: Gastroenterology

## 2017-10-19 ENCOUNTER — Ambulatory Visit (HOSPITAL_COMMUNITY)
Admission: RE | Admit: 2017-10-19 | Discharge: 2017-10-19 | Disposition: A | Discharge status: Home / Self Care | Payer: 59 | Source: Ambulatory Visit | Attending: Gastroenterology | Admitting: Gastroenterology

## 2017-10-19 DIAGNOSIS — K922 Gastrointestinal hemorrhage, unspecified: Secondary | ICD-10-CM

## 2017-10-28 NOTE — Telephone Encounter (Signed)
Reba, please give him a note for work.

## 2017-10-29 ENCOUNTER — Telehealth (INDEPENDENT_AMBULATORY_CARE_PROVIDER_SITE_OTHER): Payer: Self-pay | Admitting: Internal Medicine

## 2017-10-29 NOTE — Telephone Encounter (Signed)
Patient called to check the status of the work papers he left last week.  He stated that his employer has not received them yet.  [phone]

## 2017-10-30 ENCOUNTER — Encounter (INDEPENDENT_AMBULATORY_CARE_PROVIDER_SITE_OTHER): Payer: Self-pay | Admitting: *Deleted

## 2017-10-30 DIAGNOSIS — Z0289 Encounter for other administrative examinations: Secondary | ICD-10-CM

## 2017-10-30 NOTE — Telephone Encounter (Signed)
Patient's note and FMLA both hve been done, faxed to employer.

## 2018-07-22 DIAGNOSIS — R42 Dizziness and giddiness: Secondary | ICD-10-CM | POA: Diagnosis not present

## 2018-07-22 DIAGNOSIS — S134XXA Sprain of ligaments of cervical spine, initial encounter: Secondary | ICD-10-CM | POA: Diagnosis not present

## 2018-07-22 DIAGNOSIS — M546 Pain in thoracic spine: Secondary | ICD-10-CM | POA: Diagnosis not present

## 2018-07-24 DIAGNOSIS — M546 Pain in thoracic spine: Secondary | ICD-10-CM | POA: Diagnosis not present

## 2018-07-24 DIAGNOSIS — R42 Dizziness and giddiness: Secondary | ICD-10-CM | POA: Diagnosis not present

## 2018-07-24 DIAGNOSIS — S134XXA Sprain of ligaments of cervical spine, initial encounter: Secondary | ICD-10-CM | POA: Diagnosis not present

## 2018-10-14 DIAGNOSIS — Z6837 Body mass index (BMI) 37.0-37.9, adult: Secondary | ICD-10-CM | POA: Diagnosis not present

## 2018-10-14 DIAGNOSIS — R350 Frequency of micturition: Secondary | ICD-10-CM | POA: Diagnosis not present

## 2018-10-14 DIAGNOSIS — I10 Essential (primary) hypertension: Secondary | ICD-10-CM | POA: Diagnosis not present

## 2018-10-14 DIAGNOSIS — E1165 Type 2 diabetes mellitus with hyperglycemia: Secondary | ICD-10-CM | POA: Diagnosis not present

## 2018-10-14 DIAGNOSIS — N419 Inflammatory disease of prostate, unspecified: Secondary | ICD-10-CM | POA: Diagnosis not present

## 2018-10-29 ENCOUNTER — Encounter: Payer: Self-pay | Admitting: Allergy & Immunology

## 2018-10-29 ENCOUNTER — Ambulatory Visit: Payer: 59 | Admitting: Allergy & Immunology

## 2018-10-29 VITALS — BP 128/94 | HR 94 | Temp 98.2°F | Resp 20 | Ht 68.25 in | Wt 254.8 lb

## 2018-10-29 DIAGNOSIS — J3089 Other allergic rhinitis: Secondary | ICD-10-CM | POA: Diagnosis not present

## 2018-10-29 DIAGNOSIS — J302 Other seasonal allergic rhinitis: Secondary | ICD-10-CM | POA: Diagnosis not present

## 2018-10-29 MED ORDER — LEVOCETIRIZINE DIHYDROCHLORIDE 5 MG PO TABS: 5.0000 mg | ORAL_TABLET | Freq: Every evening | ORAL | 5 refills | Status: DC

## 2018-10-29 MED ORDER — AZELASTINE HCL 0.1 % NA SOLN: 2.0000 | Freq: Two times a day (BID) | NASAL | 5 refills | Status: DC

## 2018-10-29 MED ORDER — MONTELUKAST SODIUM 10 MG PO TABS: 10.0000 mg | ORAL_TABLET | Freq: Every day | ORAL | 5 refills | Status: AC

## 2018-10-29 NOTE — Progress Notes (Signed)
NEW PATIENT  Date of Service/Encounter:  10/29/18  Referring provider: Monico Blitz, MD   Assessment:   Seasonal and perennial allergic rhinitis (trees, weeds, grasses, indoor molds, outdoor molds, dust mites, cat and cockroach)  Plan/Recommendations:   1. Seasonal and perennial allergic rhinitis - Testing today showed: trees, weeds, grasses, indoor molds, outdoor molds, dust mites, cat and cockroach - Copy of test results provided.  - Avoidance measures provided. - Continue with: Nasacort (triamcinolone) two sprays per nostril daily - Start taking: Xyzal (levocetirizine) 5mg  tablet once daily, Singulair (montelukast) 10mg  daily and Astelin (azelastine) 2 sprays per nostril 1-2 times daily as needed - You can use an extra dose of the antihistamine, if needed, for breakthrough symptoms.  - Consider nasal saline rinses 1-2 times daily to remove allergens from the nasal cavities as well as help with mucous clearance (this is especially helpful to do before the nasal sprays are given) - Consider allergy shots as a means of long-term control. - Allergy shots "re-train" and "reset" the immune system to ignore environmental allergens and decrease the resulting immune response to those allergens (sneezing, itchy watery eyes, runny nose, nasal congestion, etc).    - Allergy shots improve symptoms in 75-85% of patients.  - We can discuss more at the next appointment if the medications are not working for you.  2. Follow up in six weeks.    Subjective:   Victor Contreras is a 57 y.o. male presenting today for evaluation of  Chief Complaint  Patient presents with  . Nasal Congestion  . Burning Eyes    Victor Contreras has a history of the following: Patient Active Problem List   Diagnosis Date Noted  . Acute posthemorrhagic anemia 10/14/2017  . Gastrointestinal hemorrhage 10/13/2017  . Hyponatremia 10/13/2017  . Mild renal insufficiency 10/13/2017  . Hypotension   . Essential  hypertension 12/13/2015  . Diabetes ([place]) 12/13/2015    History obtained from: chart review and patient.  Victor Contreras was referred by Monico Blitz, MD.     Victor Contreras is a 57 y.o. male presenting for an allergic rhinitis evaluation. He has symptoms for his entire life. He did have rhinoplasty and other sinus surgery around 30 years ago. This did help for a period of time, but over the years symptoms have worsened. He does get treated for sinus infections around 2-3 times per year. He is on prednisone during each of these sinus infections as well. Cats definitely make things worse.   Symptoms were worse in the spring or summer. But he has not noticed any difference over the last few years. He is using over the counter nose sprays without improvement. The Rhinocort and Nasacort dpo help somewhat but they cause him to cause fairly badly. He attributes this to the drainage. He has used a multitude of antihistamines. He tends to jump around from one medication to another.   He did have allergy testing (done next door in the ENT practice years ago). He was on shots for a period of time, again prescribed by the ENT. He does not currently seen an ENT doctor.   Otherwise, there is no history of other atopic diseases, including asthma, food allergies, drug allergies, stinging insect allergies, eczema or urticaria. There is no significant infectious history. Vaccinations are up to date.    Past Medical History: Patient Active Problem List   Diagnosis Date Noted  . Acute posthemorrhagic anemia 10/14/2017  . Gastrointestinal hemorrhage 10/13/2017  . Hyponatremia 10/13/2017  .  Mild renal insufficiency 10/13/2017  . Hypotension   . Essential hypertension 12/13/2015  . Diabetes ([place]) 12/13/2015    Medication List:  Allergies as of 10/29/2018   No Known Allergies     Medication List       Accurate as of October 29, 2018 11:44 AM. Always use your most recent med list.        amLODipine 5 MG  tablet Commonly known as:  NORVASC Take 5 mg by mouth daily.   azelastine 0.1 % nasal spray Commonly known as:  ASTELIN Place 2 sprays into both nostrils 2 (two) times daily.   B-COMPLEX/B-12 PO Take 1,000 mg by mouth daily.   canagliflozin 300 MG Tabs tablet Commonly known as:  INVOKANA Take 300 mg by mouth daily before breakfast.   cetirizine 10 MG tablet Commonly known as:  ZYRTEC Take 10 mg by mouth daily.   fish oil-omega-3 fatty acids 1000 MG capsule Take 1 g by mouth daily.   insulin detemir 100 UNIT/ML injection Commonly known as:  LEVEMIR Inject 10 Units into the skin daily.   levocetirizine 5 MG tablet Commonly known as:  XYZAL Take 1 tablet (5 mg total) by mouth every evening.   losartan 100 MG tablet Commonly known as:  COZAAR Take 100 mg by mouth daily.   montelukast 10 MG tablet Commonly known as:  SINGULAIR Take 1 tablet (10 mg total) by mouth at bedtime.   pantoprazole 40 MG tablet Commonly known as:  PROTONIX Take 40 mg by mouth daily.   Potassium 99 MG Tabs Take 99 mg by mouth daily.   rosuvastatin 5 MG tablet Commonly known as:  CRESTOR Take 5 mg by mouth daily.   sitaGLIPtin 100 MG tablet Commonly known as:  JANUVIA Take 100 mg by mouth daily.   tamsulosin 0.4 MG Caps capsule Commonly known as:  FLOMAX Take 0.4 mg by mouth.   vitamin E 1000 UNIT capsule Take 1,040 Units by mouth daily.       Birth History: non-contributory  Developmental History: non-contributory.   Past Surgical History: Past Surgical History:  Procedure Laterality Date  . AGILE CAPSULE N/A 10/15/2017   Procedure: AGILE CAPSULE;  Surgeon: Rogene Houston, MD;  Location: [facility];  Service: Endoscopy;  Laterality: N/A;  . AGILE CAPSULE N/A 10/18/2017   Procedure: AGILE CAPSULE;  Surgeon: Danie Binder, MD;  Location: [facility];  Service: Endoscopy;  Laterality: N/A;  7:30am  . APPENDECTOMY    . BACK SURGERY    . COLONOSCOPY N/A 01/12/2016    Procedure: COLONOSCOPY;  Surgeon: Rogene Houston, MD;  Location: [facility];  Service: Endoscopy;  Laterality: N/A;  12:00  . ESOPHAGOGASTRODUODENOSCOPY N/A 10/14/2017   Procedure: ESOPHAGOGASTRODUODENOSCOPY (EGD);  Surgeon: Rogene Houston, MD;  Location: [facility];  Service: Endoscopy;  Laterality: N/A;  . HERNIA REPAIR    . KNEE ARTHROSCOPY    . RHINOPLASTY Bilateral 1989  . SINOSCOPY       Family History: Family History  Problem Relation Age of Onset  . Allergic rhinitis Mother   . Allergic rhinitis Father   . Sudden Cardiac Death Neg Hx      Social History: Gualberto lives at home with his girlfriend.  He lives in a house that is 60 years old.  There is wood in the main living areas and carpeting in the bedroom.  They have gas heating and central cooling.  There are no animals inside or outside of the  home.  There are no dust mite covers.  There is no tobacco exposure.  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     Review of Systems: a 14-point review of systems is pertinent for what is mentioned in HPI.  Otherwise, all other systems were negative. Constitutional: negative other than that listed in the HPI Eyes: negative other than that listed in the HPI Ears, nose, mouth, throat, and face: negative other than that listed in the HPI Respiratory: negative other than that listed in the HPI Cardiovascular: negative other than that listed in the HPI Gastrointestinal: negative other than that listed in the HPI Genitourinary: negative other than that listed in the HPI Integument: negative other than that listed in the HPI Hematologic: negative other than that listed in the HPI Musculoskeletal: negative other than that listed in the HPI Neurological: negative other than that listed in the HPI Allergy/Immunologic: negative other than that listed in the HPI    Objective:   Blood pressure (!) 128/94, pulse 94, temperature 98.2 F (36.8 C),  temperature source Oral, resp. rate 20, height 5' 8.25" (1.734 m), weight 254 lb 12.8 oz (115.6 kg), SpO2 95 %. Body mass index is 38.46 kg/m.   Physical Exam:  General: Alert, interactive, in no acute distress. Nasal sounding voice. Eyes: No conjunctival injection bilaterally, no discharge on the right, no discharge on the left and no Horner-Trantas dots present. PERRL bilaterally. EOMI without pain. No photophobia.  Ears: Right TM pearly gray with normal light reflex, Left TM pearly gray with normal light reflex, Right TM intact without perforation and Left TM intact without perforation.  Nose/Throat: External nose within normal limits and septum midline. Turbinates edematous and pale with clear discharge. Posterior oropharynx erythematous with cobblestoning in the posterior oropharynx. Tonsils 2+ without exudates.  Tongue without thrush. Neck: Supple without thyromegaly. Trachea midline. Adenopathy: shoddy bilateral anterior cervical lymphadenopathy and no enlarged lymph nodes appreciated in the occipital, axillary, epitrochlear, inguinal, or popliteal regions. Lungs: Clear to auscultation without wheezing, rhonchi or rales. No increased work of breathing. CV: Normal S1/S2. No murmurs. Capillary refill <2 seconds.  Abdomen: Nondistended, nontender. No guarding or rebound tenderness. Bowel sounds present in all fields and hypoactive  Skin: Warm and dry, without lesions or rashes. Extremities:  No clubbing, cyanosis or edema. Neuro:   Grossly intact. No focal deficits appreciated. Responsive to questions.  Diagnostic studies:   Allergy Studies:   Airborne Adult Perc - November 09, 2018 1003    Time Antigen Placed  1015    Allergen Manufacturer  Lavella Hammock    Location  Back    Number of Test  59    Panel 1  Select    1. Control-Buffer 50% Glycerol  Negative    2. Control-Histamine 1 mg/ml  2+    3. Albumin saline  Negative    4. Bryant  2+    5. [place]  3+    6. Johnson  2+    7. [place] Blue   2+    8. Meadow Fescue  2+    9. Perennial Rye  2+    10. Sweet Vernal  3+    11. Timothy  2+    12. Cocklebur  Negative    13. Burweed Marshelder  Negative    14. Ragweed, short  Negative    15. Ragweed, Giant  Negative    16. Plantain,  English  Negative    17. Lamb's Quarters  Negative  18. Sheep Sorrell  Negative    19. Rough Pigweed  Negative    20. Marsh Elder, Rough  Negative    21. Mugwort, Common  Negative    22. Ash mix  Negative    23. Birch mix  Negative    24. Beech American  Negative    25. Box, Elder  Negative    26. Cedar, red  Negative    27. Cottonwood, [place]  Negative    28. Elm mix  Negative    29. Hickory mix  Negative    30. Maple mix  Negative    31. Oak, [place] mix  Negative    32. Pecan Pollen  Negative    33. Pine mix  Negative    34. Sycamore Eastern  Negative    35. Kenvir, Black Pollen  Negative    36. Alternaria alternata  Negative    37. Cladosporium Herbarum  Negative    38. Aspergillus mix  Negative    39. Penicillium mix  Negative    40. Bipolaris sorokiniana (Helminthosporium)  Negative    41. Drechslera spicifera (Curvularia)  Negative    42. Mucor plumbeus  Negative    43. Fusarium moniliforme  Negative    44. Aureobasidium pullulans (pullulara)  Negative    45. Rhizopus oryzae  Negative    46. Botrytis cinera  Negative    47. Epicoccum nigrum  Negative    48. Phoma betae  Negative    49. Candida Albicans  Negative    50. Trichophyton mentagrophytes  Negative    51. Mite, D Farinae  5,000 AU/ml  2+    52. Mite, D Pteronyssinus  5,000 AU/ml  2+    53. Cat Hair 10,000 BAU/ml  Negative    54.  Dog Epithelia  Negative    55. Mixed Feathers  Negative    56. Horse Epithelia  Negative    57. Cockroach, German  Negative    58. Mouse  Negative    59. Tobacco Leaf  Negative     Intradermal - 10/29/18 1042    Time Antigen Placed  1048    Allergen Manufacturer  Lavella Hammock    Location  Arm    Number of Test  11    Intradermal  Select     Control  Negative    Ragweed mix  3+    Weed mix  3+    Tree mix  3+    Mold 1  2+    Mold 2  Negative    Mold 3  3+    Mold 4  3+    Cat  1+    Dog  Negative    Cockroach  3+        Allergy testing results were read and interpreted by myself, documented by clinical staff.       Salvatore Marvel, MD [facility]

## 2018-10-29 NOTE — Patient Instructions (Signed)
1. Seasonal and perennial allergic rhinitis - Testing today showed: trees, weeds, grasses, indoor molds, outdoor molds, dust mites, cat and cockroach - Copy of test results provided.  - Avoidance measures provided. - Continue with: Nasacort (triamcinolone) two sprays per nostril daily - Start taking: Xyzal (levocetirizine) 5mg  tablet once daily, Singulair (montelukast) 10mg  daily and Astelin (azelastine) 2 sprays per nostril 1-2 times daily as needed - You can use an extra dose of the antihistamine, if needed, for breakthrough symptoms.  - Consider nasal saline rinses 1-2 times daily to remove allergens from the nasal cavities as well as help with mucous clearance (this is especially helpful to do before the nasal sprays are given) - Consider allergy shots as a means of long-term control. - Allergy shots "re-train" and "reset" the immune system to ignore environmental allergens and decrease the resulting immune response to those allergens (sneezing, itchy watery eyes, runny nose, nasal congestion, etc).    - Allergy shots improve symptoms in 75-85% of patients.  - We can discuss more at the next appointment if the medications are not working for you.  2. No follow-ups on file.   Please inform us of any Emergency Department visits, hospitalizations, or changes in symptoms. Call us before going to the ED for breathing or allergy symptoms since we might be able to fit you in for a sick visit. Feel free to contact us anytime with any questions, problems, or concerns.  It was a pleasure to meet you today!  Websites that have reliable patient information: 1. American Academy of Asthma, Allergy, and Immunology: www.aaaai.org 2. Food Allergy Research and Education (FARE): foodallergy.org 3. Mothers of Asthmatics: http://www.asthmacommunitynetwork.org 4. American College of Allergy, Asthma, and Immunology: MonthlyElectricBill.co.uk   Make sure you are registered to vote! If you have moved or changed any of  your contact information, you will need to get this updated before voting!    Reducing Pollen Exposure  The American Academy of Allergy, Asthma and Immunology suggests the following steps to reduce your exposure to pollen during allergy seasons.    1. Do not hang sheets or clothing out to dry; pollen may collect on these items. 2. Do not mow lawns or spend time around freshly cut grass; mowing stirs up pollen. 3. Keep windows closed at night.  Keep car windows closed while driving. 4. Minimize morning activities outdoors, a time when pollen counts are usually at their highest. 5. Stay indoors as much as possible when pollen counts or humidity is high and on windy days when pollen tends to remain in the air longer. 6. Use air conditioning when possible.  Many air conditioners have filters that trap the pollen spores. 7. Use a HEPA room air filter to remove pollen form the indoor air you breathe.  Control of Mold Allergen   Mold and fungi can grow on a variety of surfaces provided certain temperature and moisture conditions exist.  Outdoor molds grow on plants, decaying vegetation and soil.  The major outdoor mold, Alternaria and Cladosporium, are found in very high numbers during hot and dry conditions.  Generally, a late Summer - Fall peak is seen for common outdoor fungal spores.  Rain will temporarily lower outdoor mold spore count, but counts rise rapidly when the rainy period ends.  The most important indoor molds are Aspergillus and Penicillium.  Dark, humid and poorly ventilated basements are ideal sites for mold growth.  The next most common sites of mold growth are the bathroom and the kitchen.  Outdoor (Seasonal) Mold Control  1. Use air conditioning and keep windows closed 2. Avoid exposure to decaying vegetation. 3. Avoid leaf raking. 4. Avoid grain handling. 5. Consider wearing a face mask if working in moldy areas.  6.   Indoor (Perennial) Mold Control   1. Maintain  humidity below 50%. 2. Clean washable surfaces with 5% bleach solution. 3. Remove sources e.g. contaminated carpets.     Control of House Dust Mite Allergen    House dust mites play a major role in allergic asthma and rhinitis.  They occur in environments with high humidity wherever human skin, the food for dust mites is found. High levels have been detected in dust obtained from mattresses, pillows, carpets, upholstered furniture, bed covers, clothes and soft toys.  The principal allergen of the house dust mite is found in its feces.  A gram of dust may contain 1,000 mites and 250,000 fecal particles.  Mite antigen is easily measured in the air during house cleaning activities.    1. Encase mattresses, including the box spring, and pillow, in an air tight cover.  Seal the zipper end of the encased mattresses with wide adhesive tape. 2. Wash the bedding in water of 130 degrees Farenheit weekly.  Avoid cotton comforters/quilts and flannel bedding: the most ideal bed covering is the dacron comforter. 3. Remove all upholstered furniture from the bedroom. 4. Remove carpets, carpet padding, rugs, and non-washable window drapes from the bedroom.  Wash drapes weekly or use plastic window coverings. 5. Remove all non-washable stuffed toys from the bedroom.  Wash stuffed toys weekly. 6. Have the room cleaned frequently with a vacuum cleaner and a damp dust-mop.  The patient should not be in a room which is being cleaned and should wait 1 hour after cleaning before going into the room. 7. Close and seal all heating outlets in the bedroom.  Otherwise, the room will become filled with dust-laden air.  An electric heater can be used to heat the room. 8. Reduce indoor humidity to less than 50%.  Do not use a humidifier.  Control of Dog or Cat Allergen  Avoidance is the best way to manage a dog or cat allergy. If you have a dog or cat and are allergic to dog or cats, consider removing the dog or cat from  the home. If you have a dog or cat but don't want to find it a new home, or if your family wants a pet even though someone in the household is allergic, here are some strategies that may help keep symptoms at bay:  1. Keep the pet out of your bedroom and restrict it to only a few rooms. Be advised that keeping the dog or cat in only one room will not limit the allergens to that room. 2. Don't pet, hug or kiss the dog or cat; if you do, wash your hands with soap and water. 3. High-efficiency particulate air (HEPA) cleaners run continuously in a bedroom or living room can reduce allergen levels over time. 4. Regular use of a high-efficiency vacuum cleaner or a central vacuum can reduce allergen levels. 5. Giving your dog or cat a bath at least once a week can reduce airborne allergen.  Control of Cockroach Allergen  Cockroach allergen has been identified as an important cause of acute attacks of asthma, especially in urban settings.  There are fifty-five species of cockroach that exist in the [place], however only three, the [place], [place] and [place] species produce allergen  that can affect patients with Asthma.  Allergens can be obtained from fecal particles, egg casings and secretions from cockroaches.    1. Remove food sources. 2. Reduce access to water. 3. Seal access and entry points. 4. Spray runways with 0.5-1% Diazinon or Chlorpyrifos 5. Blow boric acid power under stoves and refrigerator. 6. Place bait stations (hydramethylnon) at feeding sites.  Allergy Shots   Allergies are the result of a chain reaction that starts in the immune system. Your immune system controls how your body defends itself. For instance, if you have an allergy to pollen, your immune system identifies pollen as an invader or allergen. Your immune system overreacts by producing antibodies called Immunoglobulin E (IgE). These antibodies travel to cells that release chemicals, causing an allergic  reaction.  The concept behind allergy immunotherapy, whether it is received in the form of shots or tablets, is that the immune system can be desensitized to specific allergens that trigger allergy symptoms. Although it requires time and patience, the payback can be long-term relief.  How Do Allergy Shots Work?  Allergy shots work much like a vaccine. Your body responds to injected amounts of a particular allergen given in increasing doses, eventually developing a resistance and tolerance to it. Allergy shots can lead to decreased, minimal or no allergy symptoms.  There generally are two phases: build-up and maintenance. Build-up often ranges from three to six months and involves receiving injections with increasing amounts of the allergens. The shots are typically given once or twice a week, though more rapid build-up schedules are sometimes used.  The maintenance phase begins when the most effective dose is reached. This dose is different for each person, depending on how allergic you are and your response to the build-up injections. Once the maintenance dose is reached, there are longer periods between injections, typically two to four weeks.  Occasionally doctors give cortisone-type shots that can temporarily reduce allergy symptoms. These types of shots are different and should not be confused with allergy immunotherapy shots.  Who Can Be Treated with Allergy Shots?  Allergy shots may be a good treatment approach for people with allergic rhinitis (hay fever), allergic asthma, conjunctivitis (eye allergy) or stinging insect allergy.   Before deciding to begin allergy shots, you should consider:  . The length of allergy season and the severity of your symptoms . Whether medications and/or changes to your environment can control your symptoms . Your desire to avoid long-term medication use . Time: allergy immunotherapy requires a major time commitment . Cost: may vary depending on your  insurance coverage  Allergy shots for children age 73 and older are effective and often well tolerated. They might prevent the onset of new allergen sensitivities or the progression to asthma.  Allergy shots are not started on patients who are pregnant but can be continued on patients who become pregnant while receiving them. In some patients with other medical conditions or who take certain common medications, allergy shots may be of risk. It is important to mention other medications you talk to your allergist.   When Will I Feel Better?  Some may experience decreased allergy symptoms during the build-up phase. For others, it may take as long as 12 months on the maintenance dose. If there is no improvement after a year of maintenance, your allergist will discuss other treatment options with you.  If you aren't responding to allergy shots, it may be because there is not enough dose of the allergen in your vaccine or  there are missing allergens that were not identified during your allergy testing. Other reasons could be that there are high levels of the allergen in your environment or major exposure to non-allergic triggers like tobacco smoke.  What Is the Length of Treatment?  Once the maintenance dose is reached, allergy shots are generally continued for three to five years. The decision to stop should be discussed with your allergist at that time. Some people may experience a permanent reduction of allergy symptoms. Others may relapse and a longer course of allergy shots can be considered.  What Are the Possible Reactions?  The two types of adverse reactions that can occur with allergy shots are local and systemic. Common local reactions include very mild redness and swelling at the injection site, which can happen immediately or several hours after. A systemic reaction, which is less common, affects the entire body or a particular body system. They are usually mild and typically respond  quickly to medications. Signs include increased allergy symptoms such as sneezing, a stuffy nose or hives.  Rarely, a serious systemic reaction called anaphylaxis can develop. Symptoms include swelling in the throat, wheezing, a feeling of tightness in the chest, nausea or dizziness. Most serious systemic reactions develop within 30 minutes of allergy shots. This is why it is strongly recommended you wait in your doctor's office for 30 minutes after your injections. Your allergist is trained to watch for reactions, and his or her staff is trained and equipped with the proper medications to identify and treat them.  Who Should Administer Allergy Shots?  The preferred location for receiving shots is your prescribing allergist's office. Injections can sometimes be given at another facility where the physician and staff are trained to recognize and treat reactions, and have received instructions by your prescribing allergist.

## 2018-12-10 ENCOUNTER — Ambulatory Visit: Payer: 59 | Admitting: Allergy & Immunology

## 2018-12-10 ENCOUNTER — Encounter: Payer: Self-pay | Admitting: Allergy & Immunology

## 2018-12-10 VITALS — BP 142/92 | HR 96 | Resp 16

## 2018-12-10 DIAGNOSIS — J3089 Other allergic rhinitis: Secondary | ICD-10-CM | POA: Diagnosis not present

## 2018-12-10 DIAGNOSIS — J302 Other seasonal allergic rhinitis: Secondary | ICD-10-CM

## 2018-12-10 NOTE — Patient Instructions (Addendum)
1. Seasonal and perennial allergic rhinitis (trees, weeds, grasses, indoor molds, outdoor molds, dust mites, cat and cockroach) - It seems that the medications are all working fairly well for you. - We will not make any medication changes at this time.  - Monitor symptoms over time and we can start allergy shots if you become worse.  - Continue with: Xyzal (levocetirizine) 5mg  tablet once daily, Singulair (montelukast) 10mg  daily, Nasacort (triamcinolone) two sprays per nostril daily and Astelin (azelastine) 2 sprays per nostril 1-2 times daily as needed  2. Return in about 1 year (around 12/11/2019).   Please inform us of any Emergency Department visits, hospitalizations, or changes in symptoms. Call us before going to the ED for breathing or allergy symptoms since we might be able to fit you in for a sick visit. Feel free to contact us anytime with any questions, problems, or concerns.  It was a pleasure to see you again today!  Websites that have reliable patient information: 1. American Academy of Asthma, Allergy, and Immunology: www.aaaai.org 2. Food Allergy Research and Education (FARE): foodallergy.org 3. Mothers of Asthmatics: http://www.asthmacommunitynetwork.org 4. American College of Allergy, Asthma, and Immunology: MonthlyElectricBill.co.uk   Make sure you are registered to vote! If you have moved or changed any of your contact information, you will need to get this updated before voting!    Voter ID laws are going into effect for the General Election in November 2020! Be prepared! Check out [email or web address] for more details.

## 2018-12-10 NOTE — Progress Notes (Signed)
FOLLOW UP  Date of Service/Encounter:  12/10/18   Assessment:   Seasonal and perennial allergic rhinitis (trees, weeds, grasses, indoor molds, outdoor molds, dust mites, cat and cockroach)   Plan/Recommendations:   1. Seasonal and perennial allergic rhinitis (trees, weeds, grasses, indoor molds, outdoor molds, dust mites, cat and cockroach) - It seems that the medications are all working fairly well for you. - We will not make any medication changes at this time.  - Monitor symptoms over time and we can start allergy shots if you become worse.  - Continue with: Xyzal (levocetirizine) 5mg  tablet once daily, Singulair (montelukast) 10mg  daily, Nasacort (triamcinolone) two sprays per nostril daily and Astelin (azelastine) 2 sprays per nostril 1-2 times daily as needed  2. Return in about 1 year (around 12/11/2019).  Subjective:   Victor Contreras is a 58 y.o. male presenting today for follow up of  Chief Complaint  Patient presents with  . Follow-up    Victor Contreras has a history of the following: Patient Active Problem List   Diagnosis Date Noted  . Seasonal and perennial allergic rhinitis 10/29/2018  . Acute posthemorrhagic anemia 10/14/2017  . Gastrointestinal hemorrhage 10/13/2017  . Hyponatremia 10/13/2017  . Mild renal insufficiency 10/13/2017  . Hypotension   . Essential hypertension 12/13/2015  . Diabetes ([place]) 12/13/2015    History obtained from: chart review and patient.  [facility] Primary Care Provider is Monico Blitz, MD.     Victor is a 58 y.o. male presenting for a follow up visit. Victor Contreras was last seen in December 2019 as a new patient.  At that time, he had multiple sensitizations including trees, weeds, grasses, indoor and outdoor molds, dust mites, cat, and cockroach.  We continue Nasacort 2 sprays per nostril daily and started Xyzal as well as Singulair and Astelin.  We did discuss allergy shots as a means of long-term control.  Since the last  visit, he has done very well. He reports that he is around 60 or 70% better. He has not needed antibiotics since the last visit. The coughing has improved and he is sleeping better. His throat clearing has improved. He does endorse some hoarseness intermittently.  Instead of having 7 days/week of terrible symptoms, he will now have 1 day/week at the most.  Overall, he is very happy with how he is doing.  He remains on his Protonix for his reflux.  He does not have a history of asthma.  He continues to work for [employer]. He will hit 30 years this fall and is considering retiring.  His wife is on disability due to knee problems.  Otherwise, there have been no changes to his past medical history, surgical history, family history, or social history.    Review of Systems: a 14-point review of systems is pertinent for what is mentioned in HPI.  Otherwise, all other systems were negative.  Constitutional: negative other than that listed in the HPI Eyes: negative other than that listed in the HPI Ears, nose, mouth, throat, and face: negative other than that listed in the HPI Respiratory: negative other than that listed in the HPI Cardiovascular: negative other than that listed in the HPI Gastrointestinal: negative other than that listed in the HPI Genitourinary: negative other than that listed in the HPI Integument: negative other than that listed in the HPI Hematologic: negative other than that listed in the HPI Musculoskeletal: negative other than that listed in the HPI Neurological: negative other than that  listed in the HPI Allergy/Immunologic: negative other than that listed in the HPI    Objective:   Blood pressure (!) 142/92, pulse 96, resp. rate 16, SpO2 94 %. There is no height or weight on file to calculate BMI.   Physical Exam:  General: Alert, interactive, in no acute distress. Pleasant male. Eyes: No conjunctival injection bilaterally, no discharge on the right, no discharge  on the left and no Horner-Trantas dots present. PERRL bilaterally. EOMI without pain. No photophobia.  Ears: Right TM pearly gray with normal light reflex, Left TM pearly gray with normal light reflex, Right TM intact without perforation and Left TM intact without perforation.  Nose/Throat: External nose within normal limits and septum midline. Turbinates edematous and pale with clear discharge. Posterior oropharynx erythematous without cobblestoning in the posterior oropharynx. Tonsils 2+ without exudates.  Tongue without thrush. Lungs: Clear to auscultation without wheezing, rhonchi or rales. No increased work of breathing. CV: Normal S1/S2. No murmurs. Capillary refill <2 seconds.  Skin: Warm and dry, without lesions or rashes. Neuro:   Grossly intact. No focal deficits appreciated. Responsive to questions.  Diagnostic studies: none     Salvatore Marvel, MD  [facility]

## 2018-12-27 DIAGNOSIS — E1165 Type 2 diabetes mellitus with hyperglycemia: Secondary | ICD-10-CM | POA: Diagnosis not present

## 2018-12-27 DIAGNOSIS — G4730 Sleep apnea, unspecified: Secondary | ICD-10-CM | POA: Diagnosis not present

## 2018-12-27 DIAGNOSIS — I10 Essential (primary) hypertension: Secondary | ICD-10-CM | POA: Diagnosis not present

## 2018-12-27 DIAGNOSIS — Z87891 Personal history of nicotine dependence: Secondary | ICD-10-CM | POA: Diagnosis not present

## 2019-01-15 DIAGNOSIS — E119 Type 2 diabetes mellitus without complications: Secondary | ICD-10-CM | POA: Diagnosis not present

## 2019-01-15 DIAGNOSIS — J029 Acute pharyngitis, unspecified: Secondary | ICD-10-CM | POA: Diagnosis not present

## 2019-01-15 DIAGNOSIS — J069 Acute upper respiratory infection, unspecified: Secondary | ICD-10-CM | POA: Diagnosis not present

## 2019-01-15 DIAGNOSIS — R509 Fever, unspecified: Secondary | ICD-10-CM | POA: Diagnosis not present

## 2019-03-26 DIAGNOSIS — I10 Essential (primary) hypertension: Secondary | ICD-10-CM | POA: Diagnosis not present

## 2019-03-26 DIAGNOSIS — R509 Fever, unspecified: Secondary | ICD-10-CM | POA: Diagnosis not present

## 2019-03-26 DIAGNOSIS — Z6838 Body mass index (BMI) 38.0-38.9, adult: Secondary | ICD-10-CM | POA: Diagnosis not present

## 2019-03-26 DIAGNOSIS — Z299 Encounter for prophylactic measures, unspecified: Secondary | ICD-10-CM | POA: Diagnosis not present

## 2019-03-27 DIAGNOSIS — Z20828 Contact with and (suspected) exposure to other viral communicable diseases: Secondary | ICD-10-CM | POA: Diagnosis not present

## 2019-03-29 ENCOUNTER — Other Ambulatory Visit: Payer: Self-pay

## 2019-03-29 ENCOUNTER — Encounter (HOSPITAL_COMMUNITY): Payer: Self-pay | Admitting: Emergency Medicine

## 2019-03-29 ENCOUNTER — Emergency Department (HOSPITAL_COMMUNITY)
Admission: EM | Admit: 2019-03-29 | Discharge: 2019-03-29 | Disposition: A | Discharge status: Home / Self Care | Payer: 59 | Attending: Emergency Medicine | Admitting: Emergency Medicine

## 2019-03-29 DIAGNOSIS — E119 Type 2 diabetes mellitus without complications: Secondary | ICD-10-CM | POA: Insufficient documentation

## 2019-03-29 DIAGNOSIS — U071 COVID-19: Secondary | ICD-10-CM | POA: Diagnosis not present

## 2019-03-29 DIAGNOSIS — Z79899 Other long term (current) drug therapy: Secondary | ICD-10-CM | POA: Diagnosis not present

## 2019-03-29 DIAGNOSIS — Z794 Long term (current) use of insulin: Secondary | ICD-10-CM | POA: Insufficient documentation

## 2019-03-29 DIAGNOSIS — I10 Essential (primary) hypertension: Secondary | ICD-10-CM | POA: Diagnosis not present

## 2019-03-29 NOTE — ED Notes (Addendum)
Pt refused motrin, stated he is unable to take it. EDP aware

## 2019-03-29 NOTE — Discharge Instructions (Signed)
Continue taking 1000mg  (two 500mg  tablets) of Tylenol every 6 hours.  Call your primary care doctor on Monday to schedule a follow up appointment - even if it is just a phone call to check in. You should return to ER if you feel like you are having difficulty with your breathing, new symptoms develop, you are getting worse or have additional concerns.

## 2019-03-29 NOTE — ED Provider Notes (Signed)
[facility] EMERGENCY DEPARTMENT Provider Note   CSN: [id number] Arrival date & time: 03/29/19  2001    History   Chief Complaint Chief Complaint  Patient presents with  . Covid 19    HPI Victor Contreras is a 58 y.o. male.     The history is provided by the patient and medical records. No language interpreter was used.   Victor Contreras is a 58 y.o. male  with a PMH as listed below who presents to the Emergency Department complaining of persistent fevers 2/2 covid-19.  Patient states that he developed a fever on Tuesday.  He went to [facility] where he was tested for coronavirus and did test positive.  He was discharged home to self quarantine.  He states that he has been taking Tylenol for the fevers which will help for about an hour or so and then his temperature will spike.  His temperatures have been as high as 103.5.  He thought that by today, the fevers should subside, but they have not, prompting him to seek emergency care today.  He reports body aches and chills as well as headache.  He denies any shortness of breath.  No chest pain, abdominal pain, nausea or vomiting.  Unfortunately, he has history of GERD and "bleeding" so he was told not to take NSAIDs limiting his antipyretic options.  Past Medical History:  Diagnosis Date  . Diabetes ([place])   . GERD (gastroesophageal reflux disease)   . Hypertension   . Sleep apnea     Patient Active Problem List   Diagnosis Date Noted  . Seasonal and perennial allergic rhinitis 10/29/2018  . Acute posthemorrhagic anemia 10/14/2017  . Gastrointestinal hemorrhage 10/13/2017  . Hyponatremia 10/13/2017  . Mild renal insufficiency 10/13/2017  . Hypotension   . Essential hypertension 12/13/2015  . Diabetes ([place]) 12/13/2015    Past Surgical History:  Procedure Laterality Date  . AGILE CAPSULE N/A 10/15/2017   Procedure: AGILE CAPSULE;  Surgeon: Rogene Houston, MD;  Location: [facility];  Service: Endoscopy;   Laterality: N/A;  . AGILE CAPSULE N/A 10/18/2017   Procedure: AGILE CAPSULE;  Surgeon: Danie Binder, MD;  Location: [facility];  Service: Endoscopy;  Laterality: N/A;  7:30am  . APPENDECTOMY    . BACK SURGERY    . COLONOSCOPY N/A 01/12/2016   Procedure: COLONOSCOPY;  Surgeon: Rogene Houston, MD;  Location: [facility];  Service: Endoscopy;  Laterality: N/A;  12:00  . ESOPHAGOGASTRODUODENOSCOPY N/A 10/14/2017   Procedure: ESOPHAGOGASTRODUODENOSCOPY (EGD);  Surgeon: Rogene Houston, MD;  Location: [facility];  Service: Endoscopy;  Laterality: N/A;  . HERNIA REPAIR    . KNEE ARTHROSCOPY    . RHINOPLASTY Bilateral 1989  . SINOSCOPY          Home Medications    Prior to Admission medications   Medication Sig Start Date End Date Taking? Authorizing Provider  amLODipine (NORVASC) 5 MG tablet Take 5 mg by mouth daily.    [provider]  azelastine (ASTELIN) 0.1 % nasal spray Place 2 sprays into both nostrils 2 (two) times daily. 10/29/18   Valentina Shaggy, MD  B Complex Vitamins (B-COMPLEX/B-12 PO) Take 1,000 mg by mouth daily.    [provider]  canagliflozin (INVOKANA) 300 MG TABS tablet Take 300 mg by mouth daily before breakfast.    [provider]  cetirizine (ZYRTEC) 10 MG tablet Take 10 mg by mouth daily.    [provider]  fish oil-omega-3 fatty acids 1000 MG capsule Take 1 g by mouth daily.    [provider]  insulin detemir (LEVEMIR) 100 UNIT/ML injection Inject 10 Units into the skin daily.     [provider]  levocetirizine (XYZAL) 5 MG tablet Take 1 tablet (5 mg total) by mouth every evening. 10/29/18   Valentina Shaggy, MD  losartan (COZAAR) 100 MG tablet Take 100 mg by mouth daily.    [provider]  montelukast (SINGULAIR) 10 MG tablet Take 1 tablet (10 mg total) by mouth at bedtime. 10/29/18   Valentina Shaggy, MD  pantoprazole (PROTONIX) 40 MG tablet Take 40 mg by mouth daily.     [provider]  Potassium 99 MG TABS Take 99 mg by mouth daily.    [provider]  rosuvastatin (CRESTOR) 5 MG tablet Take 5 mg by mouth daily.    [provider]  sitaGLIPtin (JANUVIA) 100 MG tablet Take 100 mg by mouth daily.    [provider]  tamsulosin (FLOMAX) 0.4 MG CAPS capsule Take 0.4 mg by mouth.    [provider]  vitamin E 1000 UNIT capsule Take 1,040 Units by mouth daily.    [provider]    Family History Family History  Problem Relation Age of Onset  . Allergic rhinitis Mother   . Allergic rhinitis Father   . Sudden Cardiac Death Neg Hx     Social History Social History   Tobacco Use  . Smoking status: Never Smoker  . Smokeless tobacco: Never Used  Substance Use Topics  . Alcohol use: Yes    Comment: 2 0r 3 days a week  . Drug use: No     Allergies   Patient has no known allergies.   Review of Systems Review of Systems  Constitutional: Positive for chills and fever.  Respiratory: Negative for cough and shortness of breath.   Gastrointestinal: Negative for abdominal pain, diarrhea, nausea and vomiting.  Musculoskeletal: Positive for myalgias. Negative for back pain and neck pain.  Skin: Negative for rash.  Neurological: Positive for headaches. Negative for dizziness, syncope and weakness.     Physical Exam Updated Vital Signs BP (!) 135/109 (BP Location: Right Arm)   Pulse (!) 118   Temp (!) 102.4 F (39.1 C) (Oral)   Resp 19   SpO2 92%   Physical Exam Vitals signs and nursing note reviewed.  Constitutional:      General: He is not in acute distress.    Appearance: He is well-developed.  HENT:     Head: Normocephalic and atraumatic.  Neck:     Musculoskeletal: Neck supple.  Cardiovascular:     Heart sounds: Normal heart sounds. No murmur.     Comments: Tachycardic, but regular. Pulmonary:     Effort: Pulmonary effort is normal. No respiratory distress.     Breath sounds:  Normal breath sounds.     Comments: Lungs clear to auscultation bilaterally.  Speaking in full sentences without any difficulty. Abdominal:     General: There is no distension.     Palpations: Abdomen is soft.     Tenderness: There is no abdominal tenderness.  Skin:    General: Skin is warm and dry.  Neurological:     Mental Status: He is alert and oriented to person, place, and time.      ED Treatments / Results  Labs (all labs ordered are listed, but only abnormal results are displayed) Labs  Reviewed - No data to display  EKG None  Radiology No results found.  Procedures Procedures (including critical care time)  Medications Ordered in ED Medications - No data to display   Initial Impression / Assessment and Plan / ED Course  I have reviewed the triage vital signs and the nursing notes.  Pertinent labs & imaging results that were available during my care of the patient were reviewed by me and considered in my medical decision making (see chart for details).       Victor Contreras is a 58 y.o. male who presents to ED for persistent fevers.  He did test positive for coronavirus at [facility] on 5/12.  He has been having temperatures of 102-103 at home and felt as if this should improve by now.  Fortunately, on exam today, he is speaking in full sentences without any difficulty.  He is not complaining of any shortness of breath or cough.  His lungs are clear.  His oxygenation is in the 90s.  He does have a temperature of 102.4 and is a little tachycardic, likely secondary to his fever.  He last took Tylenol at about 530.  Offered Tylenol here in the emergency department, but he states that he has plenty at home and would prefer not to take any right now.  Unfortunately, he cannot take NSAIDs due to GERD and bleeding risk, limiting his antipyretic control.  Overall, he is nontoxic-appearing and looks quite well despite his temperature of 102.  He has no abdominal tenderness.  We  discussed symptomatic home care management and ensuring that he is taking max dose of Tylenol.  I discussed very strict return precautions with him, especially if he develops any shortness of breath.  Recommended that he call his primary care doctor on Monday to touch base, even if this is just a telemedicine visit, to check in on how he is doing.  Evaluation does not show pathology that would require ongoing emergent intervention or inpatient treatment.  All questions were answered.  Final Clinical Impressions(s) / ED Diagnoses   Final diagnoses:  [id number]    ED Discharge Orders    None       Janautica Netzley, Ozella Almond, PA-C 03/29/19 2100    Gareth Morgan, MD 03/31/19 1513

## 2019-07-15 ENCOUNTER — Other Ambulatory Visit: Payer: Self-pay | Admitting: Allergy & Immunology

## 2019-10-28 ENCOUNTER — Other Ambulatory Visit: Payer: Self-pay | Admitting: Allergy & Immunology

## 2019-11-24 ENCOUNTER — Other Ambulatory Visit: Payer: Self-pay | Admitting: Allergy & Immunology

## 2019-12-16 ENCOUNTER — Ambulatory Visit: Payer: 59 | Admitting: Allergy & Immunology

## 2020-01-24 ENCOUNTER — Other Ambulatory Visit: Payer: Self-pay | Admitting: Allergy & Immunology

## 2020-01-31 ENCOUNTER — Other Ambulatory Visit: Payer: Self-pay | Admitting: Allergy & Immunology

## 2020-02-06 ENCOUNTER — Telehealth: Payer: Self-pay | Admitting: *Deleted

## 2020-02-06 NOTE — Telephone Encounter (Signed)
Patient is requesting prescription for Azelastine nasal spray sent to [facility] in [place].

## 2020-02-06 NOTE — Telephone Encounter (Signed)
Spoke with patient. Patient made aware that we are unable to provide a refill due to him needing an office visit and having had a courtesy refill in January. He verbalized understanding. Patient was scheduled to see Webb Silversmith on April 5th.

## 2020-02-09 NOTE — Telephone Encounter (Signed)
Noted. Thanks!   Dera Vanaken, MD [facility]   

## 2020-02-15 NOTE — Progress Notes (Addendum)
[place], [place] [address] Dept: [phone]  FOLLOW UP NOTE  Patient ID: Victor Contreras, male    DOB: 08-Jun-1961  Age: 60 y.o. MRN: [id number] Date of Office Visit: 02/16/2020  Assessment  Chief Complaint: Allergies (Nasal Congestion since he ran out of his nasal spray. Had been doing well prior to that. )  HPI Victor Contreras is a 59 year old male who presents to the clinic for a follow up visit. He was last seen in this clinic on 12/10/2018 by Dr. Ernst Bowler for evaluation of allergic rhinitis.  At today's visit, he reports allergic rhinitis has been well controlled with occasional nasal congestion, he continues montelukast 10 mg once a day, azelastine twice a day, Xyzal once a day, and occasional saline spray.  He is not currently using Nasacort.  His current medications are listed in the chart.   Drug Allergies:  No Known Allergies  Physical Exam: BP (!) 150/82 (BP Location: Left Arm, Patient Position: Sitting, Cuff Size: Normal)   Pulse (!) 102   Temp 98.2 F (36.8 C) (Temporal)   Resp 18   Ht 5' 9.2" (1.758 m)   Wt 250 lb 9.6 oz (113.7 kg)   SpO2 95%   BMI 36.79 kg/m    Physical Exam Vitals reviewed.  Constitutional:      Appearance: Normal appearance.  HENT:     Head: Normocephalic and atraumatic.     Right Ear: Tympanic membrane normal.     Left Ear: Tympanic membrane normal.     Nose:     Comments: Bilateral nares erythematous with clear nasal drainage noted. Pharynx normal. Ears normal. Eyes normal.    Mouth/Throat:     Pharynx: Oropharynx is clear.  Eyes:     Conjunctiva/sclera: Conjunctivae normal.  Cardiovascular:     Rate and Rhythm: Normal rate and regular rhythm.     Heart sounds: Normal heart sounds. No murmur.  Pulmonary:     Effort: Pulmonary effort is normal.     Breath sounds: Normal breath sounds.     Comments: Lungs clear to auscultation Musculoskeletal:        General: Normal range of motion.     Cervical back:  Normal range of motion and neck supple.  Skin:    General: Skin is warm and dry.  Neurological:     Mental Status: He is alert and oriented to person, place, and time.  Psychiatric:        Mood and Affect: Mood normal.        Behavior: Behavior normal.        Thought Content: Thought content normal.        Judgment: Judgment normal.     Assessment and Plan: 1. Seasonal and perennial allergic rhinitis   2. Essential hypertension     Meds ordered this encounter  Medications  . Azelastine HCl 137 MCG/SPRAY SOLN    Sig: Place 2 sprays into the nose 2 (two) times daily.    Dispense:  30 mL    Refill:  5  . levocetirizine (XYZAL) 5 MG tablet    Sig: Take 1 tablet (5 mg total) by mouth every evening.    Dispense:  30 tablet    Refill:  5  . triamcinolone (NASACORT) 55 MCG/ACT AERO nasal inhaler    Sig: Use 1-2 sprays in each nostril once daily as needed.    Dispense:  16.5 g    Refill:  5    Patient  Instructions  Allergic rhinitis Continue Levocetirizine 5 mg once a day as needed for a runny nose Continue montelukast 10 mg once a day for allergic symptoms Continue azelastine 2 sprays in each nostril twice a day as needed for a runny nose Continue Nasacort 1-2 sprays in each nostril once a day as needed for a stuffy nose.  In the right nostril, point the applicator out toward the right ear. In the left nostril, point the applicator out toward the left ear Consider saline nasal rinses as needed for nasal symptoms. Use this before any medicated nasal sprays for best result If medications do not relieve your symptoms, consider allergen immunotherapy as a long term solution  Hypertension Your blood pressure was elevated at your office visit today. Follow up with your primary care provider for further evaluation of your blood pressure  Call the clinic if this treatment plan is not working well for you  Follow up in 1 year or sooner if needed.   Return in about 1 year (around  02/15/2021), or if symptoms worsen or fail to improve.    Thank you for the opportunity to care for this patient.  Please do not hesitate to contact me with questions.  Gareth Morgan, FNP [facility]

## 2020-02-16 ENCOUNTER — Ambulatory Visit: Payer: 59 | Admitting: Family Medicine

## 2020-02-16 ENCOUNTER — Encounter: Payer: Self-pay | Admitting: Family Medicine

## 2020-02-16 ENCOUNTER — Other Ambulatory Visit: Payer: Self-pay

## 2020-02-16 VITALS — BP 150/82 | HR 102 | Temp 98.2°F | Resp 18 | Ht 69.2 in | Wt 250.6 lb

## 2020-02-16 DIAGNOSIS — J3089 Other allergic rhinitis: Secondary | ICD-10-CM | POA: Diagnosis not present

## 2020-02-16 DIAGNOSIS — J302 Other seasonal allergic rhinitis: Secondary | ICD-10-CM

## 2020-02-16 DIAGNOSIS — I10 Essential (primary) hypertension: Secondary | ICD-10-CM | POA: Diagnosis not present

## 2020-02-16 MED ORDER — TRIAMCINOLONE ACETONIDE 55 MCG/ACT NA AERO: INHALATION_SPRAY | NASAL | 5 refills | Status: AC

## 2020-02-16 MED ORDER — LEVOCETIRIZINE DIHYDROCHLORIDE 5 MG PO TABS: 5.0000 mg | ORAL_TABLET | Freq: Every evening | ORAL | 5 refills | Status: DC

## 2020-02-16 MED ORDER — AZELASTINE HCL 137 MCG/SPRAY NA SOLN: 2.0000 | Freq: Two times a day (BID) | NASAL | 5 refills | Status: AC

## 2020-02-16 NOTE — Patient Instructions (Addendum)
Allergic rhinitis Continue Levocetirizine 5 mg once a day as needed for a runny nose Continue montelukast 10 mg once a day for allergic symptoms Continue azelastine 2 sprays in each nostril twice a day as needed for a runny nose Continue Nasacort 1-2 sprays in each nostril once a day as needed for a stuffy nose.  In the right nostril, point the applicator out toward the right ear. In the left nostril, point the applicator out toward the left ear Consider saline nasal rinses as needed for nasal symptoms. Use this before any medicated nasal sprays for best result If medications do not relieve your symptoms, consider allergen immunotherapy as a long term solution  Hypertension Your blood pressure was elevated at your office visit today. Follow up with your primary care provider for further evaluation of your blood pressure  Call the clinic if this treatment plan is not working well for you  Follow up in 1 year or sooner if needed.

## 2020-10-25 ENCOUNTER — Other Ambulatory Visit: Payer: Self-pay | Admitting: Allergy & Immunology

## 2021-01-06 ENCOUNTER — Encounter (INDEPENDENT_AMBULATORY_CARE_PROVIDER_SITE_OTHER): Payer: Self-pay | Admitting: *Deleted

## 2021-04-05 ENCOUNTER — Other Ambulatory Visit: Payer: Self-pay | Admitting: Orthopedic Surgery

## 2021-04-05 DIAGNOSIS — M5459 Other low back pain: Secondary | ICD-10-CM

## 2021-04-17 ENCOUNTER — Ambulatory Visit
Admission: RE | Admit: 2021-04-17 | Discharge: 2021-04-17 | Disposition: A | Discharge status: Home / Self Care | Payer: 59 | Source: Ambulatory Visit | Attending: Orthopedic Surgery | Admitting: Orthopedic Surgery

## 2021-04-17 DIAGNOSIS — M5459 Other low back pain: Secondary | ICD-10-CM

## 2021-04-22 ENCOUNTER — Other Ambulatory Visit: Payer: Self-pay | Admitting: Orthopedic Surgery

## 2021-04-22 DIAGNOSIS — M545 Low back pain, unspecified: Secondary | ICD-10-CM

## 2021-05-07 ENCOUNTER — Other Ambulatory Visit: Payer: Self-pay | Admitting: Allergy & Immunology

## 2021-05-09 NOTE — Telephone Encounter (Signed)
Patient was last seen April of 2021 and was not due back until April of 2022. I left a message to let the patient know a 30 day courtesy refill has been sent in. Patient will need to call back and schedule an appointment for further refills.

## 2021-05-30 ENCOUNTER — Other Ambulatory Visit: Payer: Self-pay | Admitting: Specialist

## 2021-05-30 DIAGNOSIS — M545 Low back pain, unspecified: Secondary | ICD-10-CM

## 2021-06-16 ENCOUNTER — Other Ambulatory Visit: Payer: 59

## 2021-07-07 ENCOUNTER — Other Ambulatory Visit: Payer: Self-pay | Admitting: Specialist

## 2021-07-07 DIAGNOSIS — M545 Low back pain, unspecified: Secondary | ICD-10-CM

## 2021-08-06 ENCOUNTER — Other Ambulatory Visit: Payer: Self-pay

## 2021-08-06 ENCOUNTER — Other Ambulatory Visit: Payer: 59

## 2021-08-06 ENCOUNTER — Ambulatory Visit
Admission: RE | Admit: 2021-08-06 | Discharge: 2021-08-06 | Disposition: A | Discharge status: Home / Self Care | Payer: 59 | Source: Ambulatory Visit | Attending: Specialist | Admitting: Specialist

## 2021-08-06 DIAGNOSIS — M545 Low back pain, unspecified: Secondary | ICD-10-CM

## 2021-08-06 MED ORDER — GADOBENATE DIMEGLUMINE 529 MG/ML IV SOLN
20.0000 mL | Freq: Once | INTRAVENOUS | Status: AC | PRN
  Administered 2021-08-06: 20 mL via INTRAVENOUS

## 2022-01-22 IMAGING — MR MR LUMBAR SPINE WO/W CM
4 of 7 series · 24 of 48 positions shown · IV contrast (multihance)
Comparison: Lumbar MRI 04/17/2021 and earlier.

CLINICAL DATA: 60-year-old male with low back pain radiating down
the left leg, chronic but progressive.

EXAM:
MRI LUMBAR SPINE WITHOUT AND WITH CONTRAST
TECHNIQUE: Multiplanar and multiecho pulse sequences of the lumbar spine were
obtained without and with intravenous contrast.
CONTRAST:  20mL MULTIHANCE GADOBENATE DIMEGLUMINE 529 MG/ML IV SOLN

[Series 2: T2 · sagittal · 4.0mm · 0.53mm/px · 5 of 15 slices shown (1 of 2)]
[im 1/15]
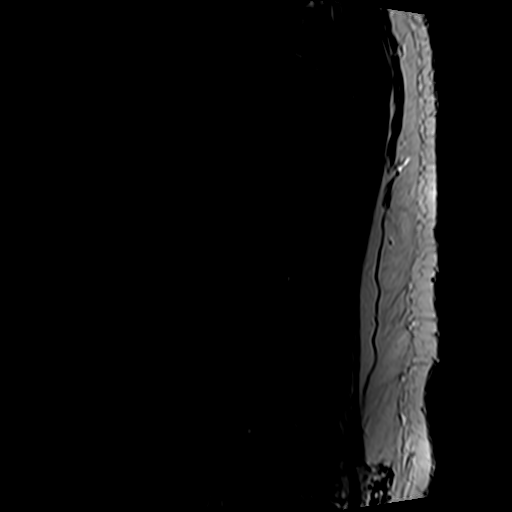
[im 4/15]
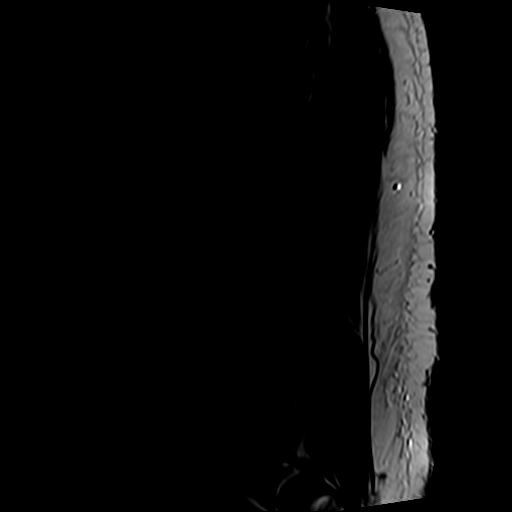
[im 8/15]
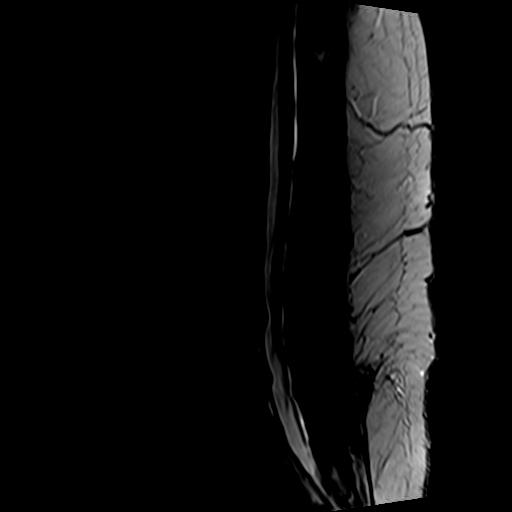
[im 11/15]
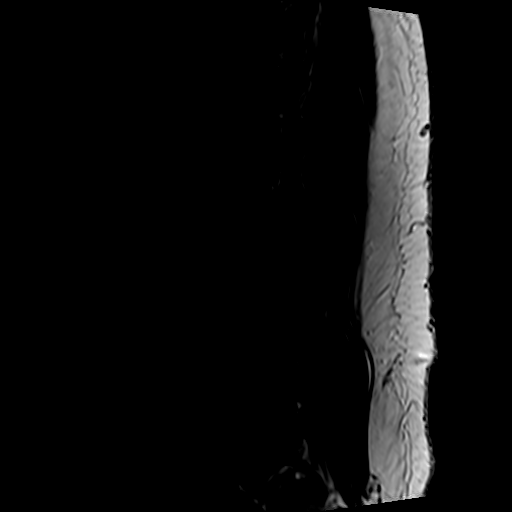
[im 15/15]
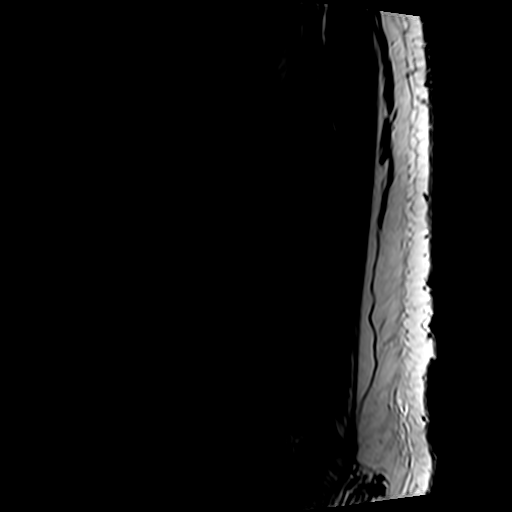

[Series 4: T1 · sagittal · 4.0mm · 0.53mm/px · 4 of 15 slices shown (1 of 2)]
[im 1/15]
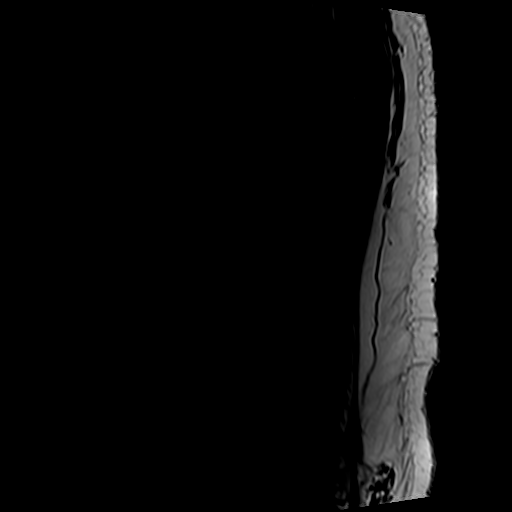
[im 5/15]
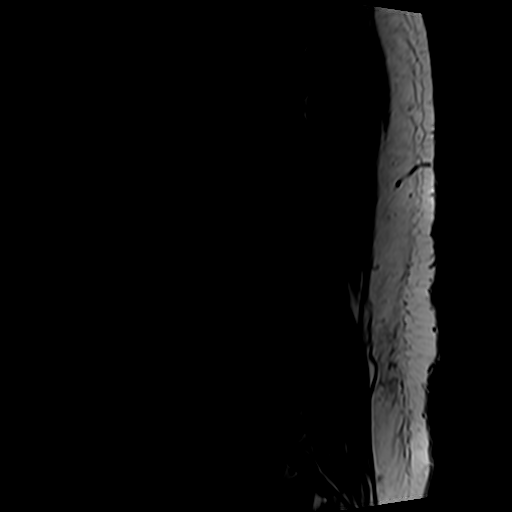
[im 10/15]
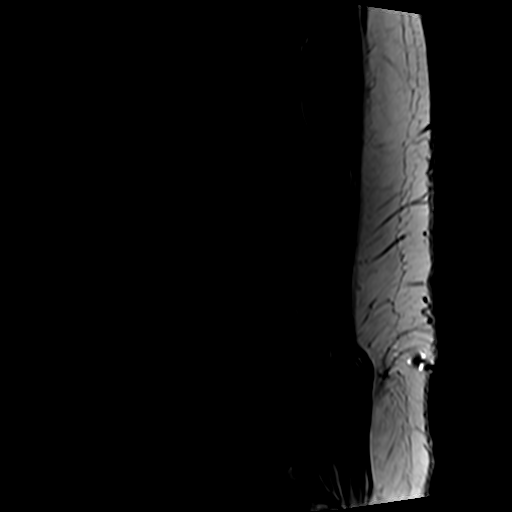
[im 15/15]
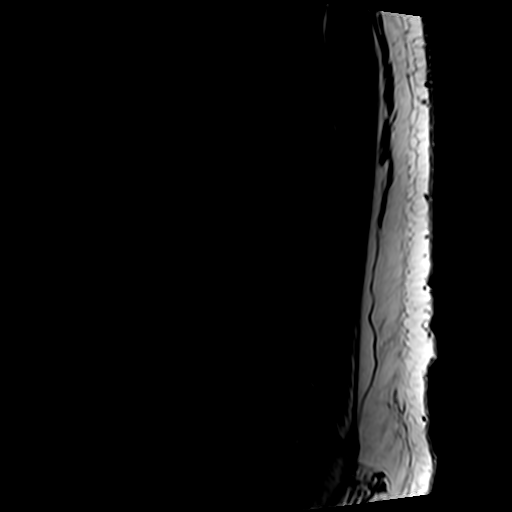

[Series 5: T2 · axial · 4.0mm · 0.70mm/px · z∈[-97,+121]mm · 8 of 35 slices shown (2 of 2)]
[im 1/35]
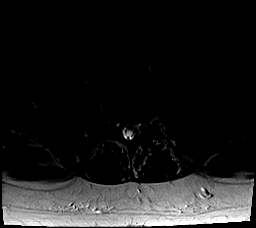
[im 4/35]
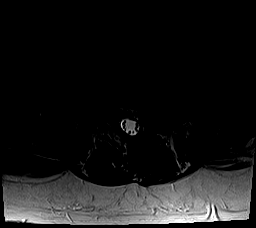
[im 12/35]
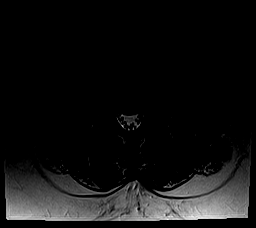
[im 16/35]
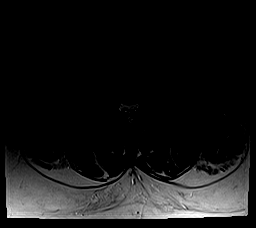
[im 19/35]
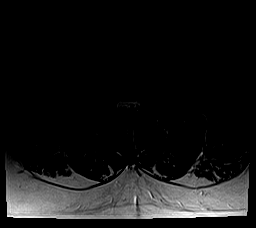
[im 23/35]
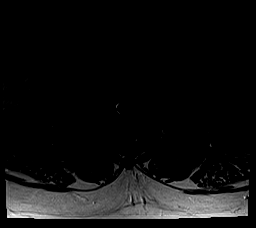
[im 31/35]
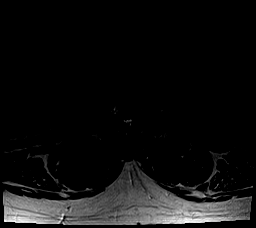
[im 35/35]
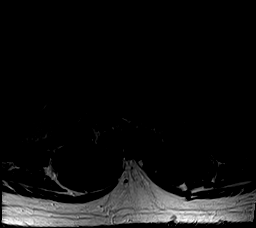

[Series 6: T1 · axial · 4.0mm · 0.35mm/px · z∈[-97,+95]mm · 7 of 35 slices shown (2 of 2)]
[im 1/35]
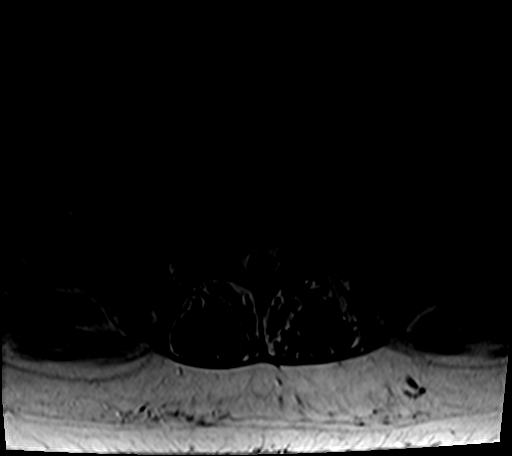
[im 4/35]
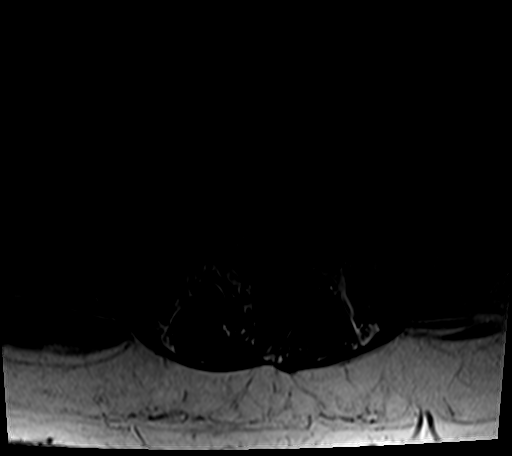
[im 12/35]
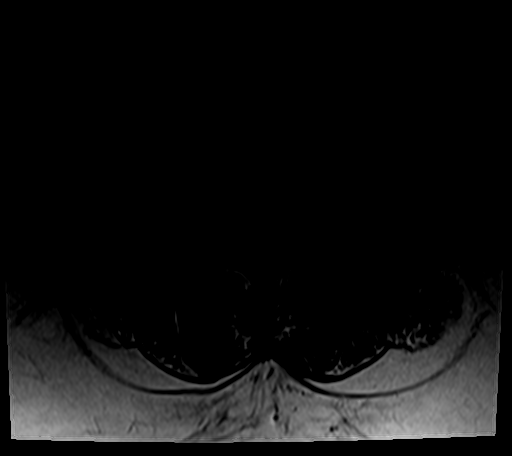
[im 16/35]
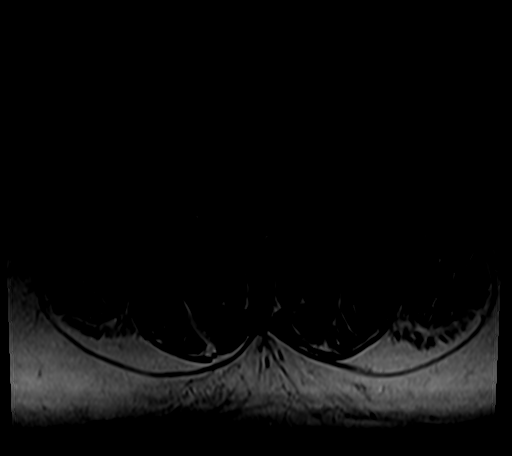
[im 19/35]
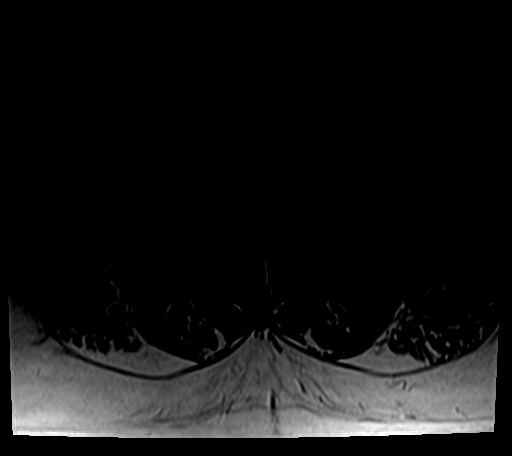
[im 23/35]
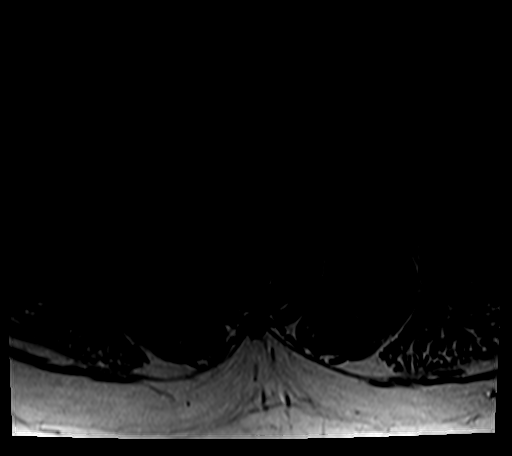
[im 31/35]
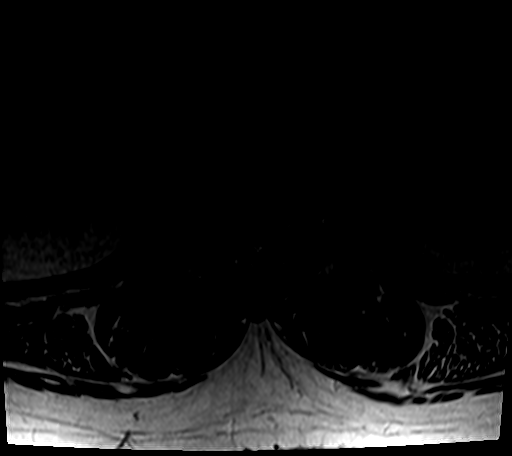

[24 of 48 positions shown; findings below may reference images not displayed]

FINDINGS: Segmentation: Normal on 2557 radiographs, the same numbering system
used on the [facility] MRI.

Alignment: Stable straightening of lumbar lordosis and mild
superimposed retrolisthesis of L5 on S1.

Vertebrae: Patchy degenerative endplate marrow edema at L5-S1,
especially anteriorly. Normal background bone marrow signal. No
other marrow edema or acute osseous abnormality. Intact visible
sacrum and SI joints.

Conus medullaris and cauda equina: Conus extends to the T12-L1
level. No lower spinal cord or conus signal abnormality. No abnormal
intradural enhancement. Fatty filum terminalis, normal variant. In
general the cauda equina nerve roots appear normal.

Paraspinal and other soft tissues: Partially visible relatively
large right renal cyst again noted, nonenhancing and likely benign.
Otherwise negative.

Disc levels:

Visible lower thoracic levels through L2-L3 remain negative.

L3-L4: Chronic disc desiccation and circumferential disc bulging
with superimposed small central disc protrusion (series 5, image
20), stable since [REDACTED]. Mild facet and ligament flavum hypertrophy.
Mild spinal stenosis. No convincing lateral recess stenosis. Stable
mild L3 foraminal stenosis.

L4-L5: Chronic disc desiccation and disc bulging with broad-based
central disc protrusion with annular fissure (series 5, image 25,
stable. Stable mild bilateral L4 foraminal stenosis, in part related
to endplate spurring.

L5-S1: Chronic retrolisthesis, disc desiccation, disc space loss,
vacuum disc. Evidence of prior laminectomy here. Chronic
architectural distortion at the left lateral recess. Following
contrast today, the nerve itself does not appear to be enlarged or
enhancing on series 7, image 31. And despite residual left lateral
recess disc material here, there is no convincing lateral recess
stenosis. No spinal stenosis. Mild to moderate left greater than
right L5 foraminal stenosis is stable.
IMPRESSION: 1. Chronic postoperative changes on the left at L5-S1. When compared
to the [facility] MRI postcontrast images today suggest architectural
distortion at the left lateral recess rather than nerve root
enlargement, nerve root mass, or stenosis. Up to moderate neural
foraminal stenosis at this level is stable and greater on the left.
Query left L5 radiculitis.

2. Other lumbar levels are stable. Small central disc protrusions
+/- annular fissure its L3-L4 and L4-L5. Up to mild spinal stenosis
at the former. Mild L3 and L4 foraminal stenosis.

## 2022-04-12 ENCOUNTER — Encounter (INDEPENDENT_AMBULATORY_CARE_PROVIDER_SITE_OTHER): Payer: 59 | Admitting: Ophthalmology

## 2022-04-12 DIAGNOSIS — H35033 Hypertensive retinopathy, bilateral: Secondary | ICD-10-CM

## 2022-04-12 DIAGNOSIS — H43813 Vitreous degeneration, bilateral: Secondary | ICD-10-CM | POA: Diagnosis not present

## 2022-04-12 DIAGNOSIS — I10 Essential (primary) hypertension: Secondary | ICD-10-CM | POA: Diagnosis not present

## 2022-04-12 DIAGNOSIS — H31003 Unspecified chorioretinal scars, bilateral: Secondary | ICD-10-CM | POA: Diagnosis not present
